# Patient Record
Sex: Female | Born: 1950 | Race: White | Hispanic: No | Marital: Married | Smoking: Never smoker
Health system: Southern US, Community
[De-identification: ages and names within clinical notes are randomized; demographics above are authoritative.]

## PROBLEM LIST (undated history)

## (undated) HISTORY — PX: DE QUERVAIN'S RELEASE: SHX1439

## (undated) HISTORY — PX: TOTAL VAGINAL HYSTERECTOMY: SHX2548

## (undated) HISTORY — PX: OTHER SURGICAL HISTORY: SHX169

## (undated) HISTORY — PX: ANKLE SURGERY: SHX546

## (undated) HISTORY — PX: TUBAL LIGATION: SHX77

## (undated) HISTORY — PX: ASPIRATION OF ABSCESS: SHX6754

## (undated) HISTORY — PX: CHOLECYSTECTOMY: SHX55

## (undated) HISTORY — PX: HERNIA REPAIR: SHX51

## (undated) HISTORY — PX: NASAL SEPTUM SURGERY: SHX37

---

## 2016-08-26 ENCOUNTER — Encounter: Payer: Self-pay | Admitting: Physical Medicine & Rehabilitation

## 2016-10-11 ENCOUNTER — Encounter: Payer: Medicare HMO | Attending: Physical Medicine & Rehabilitation | Admitting: Physical Medicine & Rehabilitation

## 2016-10-11 ENCOUNTER — Encounter: Payer: Self-pay | Admitting: Physical Medicine & Rehabilitation

## 2016-10-11 VITALS — BP 131/80 | HR 77 | Ht 60.0 in | Wt 181.0 lb

## 2016-10-11 DIAGNOSIS — M545 Low back pain: Secondary | ICD-10-CM | POA: Diagnosis present

## 2016-10-11 DIAGNOSIS — M533 Sacrococcygeal disorders, not elsewhere classified: Secondary | ICD-10-CM

## 2016-10-11 DIAGNOSIS — G8929 Other chronic pain: Secondary | ICD-10-CM | POA: Insufficient documentation

## 2016-10-11 DIAGNOSIS — M797 Fibromyalgia: Secondary | ICD-10-CM

## 2016-10-11 DIAGNOSIS — Z9889 Other specified postprocedural states: Secondary | ICD-10-CM | POA: Insufficient documentation

## 2016-10-11 DIAGNOSIS — M4186 Other forms of scoliosis, lumbar region: Secondary | ICD-10-CM | POA: Diagnosis not present

## 2016-10-11 DIAGNOSIS — Z79899 Other long term (current) drug therapy: Secondary | ICD-10-CM

## 2016-10-11 DIAGNOSIS — M4126 Other idiopathic scoliosis, lumbar region: Secondary | ICD-10-CM

## 2016-10-11 DIAGNOSIS — M542 Cervicalgia: Secondary | ICD-10-CM | POA: Diagnosis not present

## 2016-10-11 DIAGNOSIS — Z5181 Encounter for therapeutic drug level monitoring: Secondary | ICD-10-CM

## 2016-10-11 DIAGNOSIS — Z90710 Acquired absence of both cervix and uterus: Secondary | ICD-10-CM | POA: Insufficient documentation

## 2016-10-11 DIAGNOSIS — Z9049 Acquired absence of other specified parts of digestive tract: Secondary | ICD-10-CM | POA: Insufficient documentation

## 2016-10-11 DIAGNOSIS — M48061 Spinal stenosis, lumbar region without neurogenic claudication: Secondary | ICD-10-CM | POA: Insufficient documentation

## 2016-10-11 DIAGNOSIS — M419 Scoliosis, unspecified: Secondary | ICD-10-CM | POA: Insufficient documentation

## 2016-10-11 DIAGNOSIS — M25511 Pain in right shoulder: Secondary | ICD-10-CM | POA: Insufficient documentation

## 2016-10-11 DIAGNOSIS — M47816 Spondylosis without myelopathy or radiculopathy, lumbar region: Secondary | ICD-10-CM

## 2016-10-11 MED ORDER — GABAPENTIN 100 MG PO CAPS: 100.0000 mg | ORAL_CAPSULE | Freq: Three times a day (TID) | ORAL | 3 refills | Status: DC

## 2016-10-11 NOTE — Patient Instructions (Addendum)
PLEASE FEEL FREE TO CALL OUR OFFICE WITH ANY PROBLEMS OR QUESTIONS [phone])  ONCE I HAVE CONFIRMATION THAT YOUR drug  SPECIMEN IS CONSISTENT WITH YOUR HISTORY AND PRESCRIBED MEDICATIONS, I WILL BE WILLING TO PRESCRIBE YOUR PAIN MEDICATION. THE RESULTS OF YOUR URINE TESTING COULD TAKE A WEEK OR MORE TO RETURN, HOWEVER.  IF WE DO NOT CONTACT YOU REGARDING THESE RESULTS WITHIN 10 DAYS, PLEASE CONTACT US.

## 2016-10-11 NOTE — Progress Notes (Signed)
Subjective:    Patient ID: Leah Barton, female    DOB: Apr 08, 1950, 66 y.o.   MRN: [id number]  HPI   This is an initial visit for Leah Barton who has had a 40 year history of chronic low back pain which she attributes to bad techniques when she took care of her daughter, morbid obesity. 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 She tries to be careful what she lifts and when she uses her hands over her head. Most commonly the pain is at her belt line and radiates around the hips to the lateral knee. Standing and walking/activities make the pain worse. Bending sometimes is difficult too. The more she does, the harder it is the following day. The numbness in her leg typically doesn't travel past the knee although she feels some tingling when she stands after sitting too long.   She was diagnosed with fibromyalgia 25-30 years ago at [facility] which causes chronic diffuse pain and fatigue. She was placed on savella about 6 months ago which has really helped. She is concerned that it might be causing some sweating. Baclofen seems to help with spasms.     I found MRI's  from 2016 of the cervical and lumbar spine  C2-C3:No stenosis  C3-C4:Disc osteophyte complex with mild central and bilateral moderate neural foraminal narrowing  C4-C5:Disc osteophyte complex and uncovertebral hypertrophy with mild left neural foraminal narrowing.  C5-C6:Marked disc desiccation with left greater than right uncovertebral hypertrophy and disc osteophyte complex resulting in moderate central, moderate right and moderate left neural foraminal narrowing.  C6-C7: Disc osteophyte complex with left greater than right uncovertebral hypertrophy resulting in mild to moderate central narrowing and mild to moderate bilateral neural foraminal narrowing.  C7-T1:Moderate right and mild left neural foraminal narrowing. No central narrowing  T1-T2:No  significant stenosis   Lumbar:  Leftward curve convex at L3-4  T11-T12: Circumferential bulging disc and mild facet hypertrophy resulting in mild central narrowing and mild bilateral neural foraminal narrowing.  T12-L1: Mild facet hypertrophy without stenosis  L1-L2: Mild facet hypertrophy and circumferential bulging disc with superimposed small central extrusion, with moderate right and mild left neural foraminal narrowing. No central narrowing  L2-L3: Circumferential bulging disc and moderate facet hypertrophy with mild central stenosis. No neural foraminal narrowing  L3-L4: Desiccated circumferential bulging disc with moderate facet hypertrophy and leftward spinal curvature resulting in mild to moderate central narrowing , severe right lateral recess narrowing, mild left lateral recess narrowing, severe right neural foraminal narrowing and mild left neural foraminal narrowing.  L4-L5:Marked facet hypertrophy and spinal curvature results in moderate narrowing of the lateral recesses, left greater than right, severe left neural foraminal narrowing and mild central narrowing  L5-S1: Moderate facet hypertrophy and spinal curvature resulting in severe left neural foraminal narrowing and no central narrowing.Hemi sacralization of the left L5 transverse process, with hypertrophy   She had MBB and then RF ablation at [facility] in early 2016 which lasted a few months as far as her back is concerned. She had a Left L3-4 traslaminar ESI which gave one month of relief in 2011. She's also had bursa injections to  Hips, SI injections as well. Th e SI injections on 10/07/14 provided 70% relief for approximattely 6 months.   In addition to the above medications, for pain relief she had been using hydrocodone 7.5mg  3-4 x per day. Currently, she has been stretching out her ongoing supply to  get to her visit here. She uses mysoline for familial tremors.   She has not had any recent therapy for her low back.  The last time she had therapy it was for a remote ortho issue.    Pain Inventory Average Pain 5 Pain Right Now 7 My pain is sharp, burning, dull, stabbing and aching  In the last 24 hours, has pain interfered with the following? General activity 8 Relation with others 8 Enjoyment of life 10 What TIME of day is your pain at its worst? . Sleep (in general) Fair  Pain is worse with: walking Pain improves with: medication and injections Relief from Meds: 6  Mobility walk with assistance use a cane do you drive?  yes  Function disabled: date disabled 2015  Neuro/Psych bladder control problems numbness tremor tingling trouble walking spasms dizziness confusion depression anxiety  Prior Studies Any changes since last visit?  no  Physicians involved in your care Any changes since last visit?  no   History reviewed. No pertinent family history. Social History   Social History  . Marital status: Married    Spouse name: N/A  . Number of children: N/A  . Years of education: N/A   Social History Main Topics  . Smoking status: Never Smoker  . Smokeless tobacco: Never Used  . Alcohol use No  . Drug use: No  . Sexual activity: Not Asked   Other Topics Concern  . None   Social History Narrative  . None   Past Surgical History:  Procedure Laterality Date  . ANKLE SURGERY    . ASPIRATION OF ABSCESS    . CHOLECYSTECTOMY    . DE QUERVAIN'S RELEASE    . grastricbypas    . HERNIA REPAIR    . NASAL SEPTUM SURGERY    . TOTAL VAGINAL HYSTERECTOMY    . TUBAL LIGATION     History reviewed. No pertinent past medical history. There were no vitals taken for this visit.  Opioid Risk Score:   Fall Risk Score:  `1  Depression screen PHQ 2/9  No flowsheet data found.   Review of Systems  Constitutional: Negative.   HENT: Negative.   Eyes: Negative.   Respiratory: Positive for apnea.   Cardiovascular: Negative.   Gastrointestinal: Negative.   Endocrine:  Negative.   Genitourinary: Negative.   Musculoskeletal: Negative.   Skin: Negative.   Allergic/Immunologic: Negative.   Neurological: Negative.   Hematological: Negative.   Psychiatric/Behavioral: Negative.   All other systems reviewed and are negative.      Objective:   Physical Exam  General: Alert and oriented x 3, No apparent distress. obese HEENT: Head is normocephalic, atraumatic, PERRLA, EOMI, sclera anicteric, oral mucosa pink and moist, dentition intact, ext ear canals clear,  Neck: Supple without JVD or lymphadenopathy Heart: Reg rate and rhythm. No murmurs rubs or gallops Chest: CTA bilaterally without wheezes, rales, or rhonchi; no distress Abdomen: Soft, non-tender, non-distended, bowel sounds positive. Extremities: No clubbing, cyanosis, or edema. Pulses are 2+ Skin: Clean and intact without signs of breakdown Neuro: Pt is cognitively appropriate with normal insight, memory, and awareness. Cranial nerves 2-12 are intact. Sensory exam is normal. Reflexes are 2+ in all 4's. Fine motor coordination is intact. No tremors. Motor function is grossly 5/5.  Musculoskeletal: leftward high lumbar scoliosis---right hemipelvis elevated and left shoulder elevated as a result. Pain with lumbar extension more than flexion although flexion limited to about 80 degrees. Facet maneuvers were positive right more than left. Lateral  bending to right caused tenderness. Rotation didn't cause substantial pain. Pain to palp along bilateral PSIS's as well as in posterior glutes and external hip rotators. Greater trochs were both tenders. Numerous tender areas along neck/head/trunk/amrs/legs. Both knees tender with resisted flexion/extension. Psych: Pt's affect is appropriate. Pt is cooperative         Assessment & Plan:  1. Chronic low back pain with substantial spondylosis and scoliosis by MRI. Symptoms on examination today are most c/w facet arthropathy and SI joint dysfunction. Symptoms are  most severe in the right low back and pelvis.  2. Fibromyalgia 3. Chronic cervicalgia 4. Right shoulder pain   Plan: 1. Trial of gabapentin for her FMS and to assist with her back pain 2. Drug testing was performed today. Consider rx of hydrocodone 7.5 for pain control if testing is consistent, 2-3x daily prn 3. SI joint and facet stretches were provided and resumed  4. D/C cymbalta?? Not needed with savella---serotonin/NEP overload.  5. Consider course of PT although co-pays are a concern for her.  6. Consider MBB's/RF's and SI joint injections.    7. Forty-five minutes of face to face patient care time were spent during this visit. All questions were encouraged and answered. Follow up with me in about a month.

## 2016-10-15 ENCOUNTER — Telehealth: Payer: Self-pay | Admitting: *Deleted

## 2016-10-15 NOTE — Telephone Encounter (Signed)
Leah Barton is calling to see if she can get an order for medication.  Her oral drug screen is not complete yet so no order can be given at this time. I have notfied Leah Barton that it will be ready probably the first part of next week.

## 2016-10-16 LAB — DRUG TOX MONITOR 1 W/CONF, ORAL FLD
AMOBARBITAL: NEGATIVE ng/mL (ref <10)
AMPHETAMINES: NEGATIVE ng/mL (ref <10)
BARBITURATES: POSITIVE ng/mL — AB (ref <10)
BUTALBITAL: NEGATIVE ng/mL (ref <10)
Benzodiazepines: NEGATIVE ng/mL (ref <0.50)
Buprenorphine: NEGATIVE ng/mL (ref <0.025)
CODEINE: NEGATIVE ng/mL (ref <2.5)
Cocaine: NEGATIVE ng/mL (ref <2.5)
Dihydrocodeine: NEGATIVE ng/mL (ref <2.5)
Fentanyl: NEGATIVE ng/mL (ref <0.10)
HEROIN METABOLITE: NEGATIVE ng/mL (ref <1.0)
Hydrocodone: 12.6 ng/mL — ABNORMAL HIGH (ref <2.5)
Hydromorphone: NEGATIVE ng/mL (ref <2.5)
MDMA: NEGATIVE ng/mL (ref <10)
MEPERIDINE: NEGATIVE ng/mL (ref <5.0)
Marijuana: NEGATIVE ng/mL (ref <2.5)
Meprobamate: NEGATIVE ng/mL (ref <2.5)
Methadone: NEGATIVE ng/mL (ref <5.0)
Morphine: NEGATIVE ng/mL (ref <2.5)
NICOTINE METABOLITE: NEGATIVE ng/mL (ref <5.0)
NOROXYCODONE: NEGATIVE ng/mL (ref <2.5)
Norhydrocodone: 3.1 ng/mL — ABNORMAL HIGH (ref <2.5)
OPIATES: POSITIVE ng/mL — AB (ref <2.5)
OXYCODONE: NEGATIVE ng/mL (ref <2.5)
OXYMORPHONE: NEGATIVE ng/mL (ref <2.5)
PHENOBARBITAL: 198 ng/mL — AB (ref <10)
Pentobarbital: NEGATIVE ng/mL (ref <10)
Phencyclidine: NEGATIVE ng/mL (ref <10)
Propoxyphene: NEGATIVE ng/mL (ref <5.0)
SECOBARBITAL: NEGATIVE ng/mL (ref <10)
Tapentadol: NEGATIVE ng/mL (ref <5.0)
Tramadol: NEGATIVE ng/mL (ref <5.0)
ZOLPIDEM: NEGATIVE ng/mL (ref <5.0)

## 2016-10-16 LAB — DRUG TOX ALC METAB W/CON, ORAL FLD: Alcohol Metabolite: NEGATIVE ng/mL (ref <25)

## 2016-10-18 ENCOUNTER — Telehealth: Payer: Self-pay | Admitting: *Deleted

## 2016-10-18 MED ORDER — HYDROCODONE-ACETAMINOPHEN 7.5-325 MG PO TABS: 1.0000 | ORAL_TABLET | Freq: Three times a day (TID) | ORAL | 0 refills | Status: DC | PRN

## 2016-10-18 NOTE — Telephone Encounter (Signed)
Oral swab drug screen was positive for phenobarbital which is a metabolite of her primidone. Hydrocodone is present and she has had a [facility] for this [facility] There was no documentation of when she had a last dose so it is assumed she took one of her prior prescribed hydrocodone. This will be considered consistent.  Per Dr Leah KillSwartz note Leah Barton will sign a prescription for her to begin hydrocodone 7.5-325 tid prn #90. Leah Barton notified. A CSA will be signed at time of Rx pick up.She has been instructed to take only pain medications from Dr Tonita CongSwartz/Eunice Thomas NP from this point forward.

## 2016-11-22 ENCOUNTER — Encounter: Payer: Medicare HMO | Attending: Physical Medicine & Rehabilitation | Admitting: Registered Nurse

## 2016-11-22 ENCOUNTER — Encounter: Payer: Medicare HMO | Admitting: Physical Medicine & Rehabilitation

## 2016-11-22 ENCOUNTER — Encounter: Payer: Self-pay | Admitting: Registered Nurse

## 2016-11-22 VITALS — BP 112/69 | HR 75

## 2016-11-22 DIAGNOSIS — M48061 Spinal stenosis, lumbar region without neurogenic claudication: Secondary | ICD-10-CM | POA: Diagnosis not present

## 2016-11-22 DIAGNOSIS — M47816 Spondylosis without myelopathy or radiculopathy, lumbar region: Secondary | ICD-10-CM | POA: Diagnosis not present

## 2016-11-22 DIAGNOSIS — Z90710 Acquired absence of both cervix and uterus: Secondary | ICD-10-CM | POA: Diagnosis not present

## 2016-11-22 DIAGNOSIS — M797 Fibromyalgia: Secondary | ICD-10-CM | POA: Diagnosis not present

## 2016-11-22 DIAGNOSIS — M542 Cervicalgia: Secondary | ICD-10-CM | POA: Diagnosis not present

## 2016-11-22 DIAGNOSIS — Z9049 Acquired absence of other specified parts of digestive tract: Secondary | ICD-10-CM | POA: Diagnosis not present

## 2016-11-22 DIAGNOSIS — Z5181 Encounter for therapeutic drug level monitoring: Secondary | ICD-10-CM

## 2016-11-22 DIAGNOSIS — M25511 Pain in right shoulder: Secondary | ICD-10-CM | POA: Diagnosis not present

## 2016-11-22 DIAGNOSIS — Z79899 Other long term (current) drug therapy: Secondary | ICD-10-CM | POA: Diagnosis not present

## 2016-11-22 DIAGNOSIS — Z9889 Other specified postprocedural states: Secondary | ICD-10-CM | POA: Diagnosis not present

## 2016-11-22 DIAGNOSIS — M4186 Other forms of scoliosis, lumbar region: Secondary | ICD-10-CM | POA: Insufficient documentation

## 2016-11-22 DIAGNOSIS — G8929 Other chronic pain: Secondary | ICD-10-CM | POA: Insufficient documentation

## 2016-11-22 DIAGNOSIS — M533 Sacrococcygeal disorders, not elsewhere classified: Secondary | ICD-10-CM | POA: Diagnosis not present

## 2016-11-22 DIAGNOSIS — M545 Low back pain: Secondary | ICD-10-CM | POA: Diagnosis present

## 2016-11-22 MED ORDER — HYDROCODONE-ACETAMINOPHEN 7.5-325 MG PO TABS: 1.0000 | ORAL_TABLET | Freq: Three times a day (TID) | ORAL | 0 refills | Status: DC | PRN

## 2016-11-22 NOTE — Progress Notes (Signed)
Subjective:    Patient ID: Leah Barton, female    DOB: 12-25-1950, 66 y.o.   MRN: [id number]  HPI: [name] is a 66 year old female who returns for follow up appointment and medication refill. She states her pai  Is located in her neck, lower back and bilateral knees. Also states she has joint pain all over. Her current exercise regime is walking.   Last Oral Swab was performed on 10/11/2016, it was consistent.    Pain Inventory Average Pain 5 Pain Right Now 5 My pain is sharp, burning, tingling and aching  In the last 24 hours, has pain interfered with the following? General activity 6 Relation with others 6 Enjoyment of life 6 What TIME of day is your pain at its worst? all Sleep (in general) Fair  Pain is worse with: walking, bending, sitting, standing and some activites Pain improves with: rest, medication and injections Relief from Meds: 6  Mobility how many minutes can you walk? 5-10 do you drive?  yes  Function disabled: date disabled 10/2013  Neuro/Psych weakness numbness tremor trouble walking dizziness confusion depression anxiety  Prior Studies Any changes since last visit?  no  Physicians involved in your care Any changes since last visit?  no   History reviewed. No pertinent family history. Social History   Social History  . Marital status: Married    Spouse name: N/A  . Number of children: N/A  . Years of education: N/A   Social History Main Topics  . Smoking status: Never Smoker  . Smokeless tobacco: Never Used  . Alcohol use No  . Drug use: No  . Sexual activity: Not Asked   Other Topics Concern  . None   Social History Narrative  . None   Past Surgical History:  Procedure Laterality Date  . ANKLE SURGERY    . ASPIRATION OF ABSCESS    . CHOLECYSTECTOMY    . DE QUERVAIN'S RELEASE    . grastricbypas    . HERNIA REPAIR    . NASAL SEPTUM SURGERY    . TOTAL VAGINAL HYSTERECTOMY    . TUBAL LIGATION     History  reviewed. No pertinent past medical history. BP 112/69 (BP Location: Left Arm, Patient Position: Sitting, Cuff Size: Normal)   Pulse 75   SpO2 98%   Opioid Risk Score:  7 Fall Risk Score:  `1  Depression screen PHQ 2/9  Depression screen [facility] 2/9 11/22/2016 10/11/2016  Decreased Interest 2 2  Down, Depressed, Hopeless 2 2  PHQ - 2 Score 4 4  Altered sleeping - 2  Tired, decreased energy - 3  Change in appetite - 3  Feeling bad or failure about yourself  - 1  Trouble concentrating - 1  Moving slowly or fidgety/restless - 0  Suicidal thoughts - 1  PHQ-9 Score - 15  Difficult doing work/chores - Somewhat difficult    Review of Systems  HENT: Negative.   Eyes: Negative.   Respiratory: Negative.   Cardiovascular: Negative.   Gastrointestinal: Negative.   Endocrine: Negative.   Genitourinary: Positive for dysuria.       Interstitial cystitis  Musculoskeletal: Positive for gait problem.  Skin: Negative.   Allergic/Immunologic: Negative.   Neurological: Positive for dizziness, tremors, weakness and numbness.  Psychiatric/Behavioral: Positive for confusion and dysphoric mood. The patient is nervous/anxious.   All other systems reviewed and are negative.      Objective:   Physical Exam  Constitutional: She is oriented to  person, place, and time. She appears well-developed and well-nourished.  HENT:  Head: Normocephalic and atraumatic.  Neck: Normal range of motion. Neck supple.  Cardiovascular: Normal rate and regular rhythm.   Pulmonary/Chest: Effort normal and breath sounds normal.  Musculoskeletal:  Normal Muscle Bulk and Muscle Testing Reveals: Upper Extremities: Full ROM and Muscle Strength 5/5 Thoracic Paraspinal Tenderness: T-7-T-9 Lumbar Paraspinal Tenderness: L-3-L-5 Lower Extremities: Full ROM and Muscle Strength 5/5 Arises from chair with ease Narrow Based Gait  Neurological: She is alert and oriented to person, place, and time.  Skin: Skin is warm and dry.    Psychiatric: She has a normal mood and affect.  Nursing note and vitals reviewed.         Assessment & Plan:  1. Chronic low back pain with substantial spondylosis and scoliosis by MRI per Dr. Riley KillSwartz note. Refilled: Hydrocodone 7.5/325 mg one tablet every 8 hours as needed #90.  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 Riley KillSwartz Note MBB"s/RF's and SI joint Injections will be considered.  3. Fibromyalgia: Continue Gabapentin and HEP as Tolerated  30  minutes of face to face patient care time waas spent during this visit. All questions were encouraged and answered.   Follow up in 1 month.

## 2016-11-24 ENCOUNTER — Telehealth: Payer: Self-pay | Admitting: Registered Nurse

## 2016-11-24 NOTE — Telephone Encounter (Signed)
On 11/24/2016 the NCCSR was reviewed no conflict was seen [place] the [facility] Cont[place] Sub[place] Reporting System with multiple prescribers. [name] has a signed narcotic contract with our office. If there were any discrepancies this would have been reported to her physician.

## 2016-12-27 ENCOUNTER — Encounter: Payer: Medicare HMO | Admitting: Registered Nurse

## 2016-12-30 ENCOUNTER — Encounter: Payer: Self-pay | Admitting: Registered Nurse

## 2016-12-30 ENCOUNTER — Encounter: Payer: Medicare HMO | Attending: Physical Medicine & Rehabilitation | Admitting: Registered Nurse

## 2016-12-30 VITALS — BP 133/69 | HR 66

## 2016-12-30 DIAGNOSIS — M47816 Spondylosis without myelopathy or radiculopathy, lumbar region: Secondary | ICD-10-CM | POA: Diagnosis not present

## 2016-12-30 DIAGNOSIS — Z9889 Other specified postprocedural states: Secondary | ICD-10-CM | POA: Insufficient documentation

## 2016-12-30 DIAGNOSIS — M7061 Trochanteric bursitis, right hip: Secondary | ICD-10-CM

## 2016-12-30 DIAGNOSIS — G8929 Other chronic pain: Secondary | ICD-10-CM | POA: Diagnosis present

## 2016-12-30 DIAGNOSIS — M4186 Other forms of scoliosis, lumbar region: Secondary | ICD-10-CM | POA: Insufficient documentation

## 2016-12-30 DIAGNOSIS — M542 Cervicalgia: Secondary | ICD-10-CM | POA: Insufficient documentation

## 2016-12-30 DIAGNOSIS — M797 Fibromyalgia: Secondary | ICD-10-CM | POA: Insufficient documentation

## 2016-12-30 DIAGNOSIS — M7062 Trochanteric bursitis, left hip: Secondary | ICD-10-CM

## 2016-12-30 DIAGNOSIS — M25511 Pain in right shoulder: Secondary | ICD-10-CM | POA: Diagnosis not present

## 2016-12-30 DIAGNOSIS — Z79899 Other long term (current) drug therapy: Secondary | ICD-10-CM | POA: Insufficient documentation

## 2016-12-30 DIAGNOSIS — M25562 Pain in left knee: Secondary | ICD-10-CM | POA: Diagnosis not present

## 2016-12-30 DIAGNOSIS — G894 Chronic pain syndrome: Secondary | ICD-10-CM

## 2016-12-30 DIAGNOSIS — M545 Low back pain: Secondary | ICD-10-CM | POA: Diagnosis present

## 2016-12-30 DIAGNOSIS — Z5181 Encounter for therapeutic drug level monitoring: Secondary | ICD-10-CM | POA: Diagnosis not present

## 2016-12-30 DIAGNOSIS — M48061 Spinal stenosis, lumbar region without neurogenic claudication: Secondary | ICD-10-CM | POA: Diagnosis not present

## 2016-12-30 DIAGNOSIS — Z9049 Acquired absence of other specified parts of digestive tract: Secondary | ICD-10-CM | POA: Insufficient documentation

## 2016-12-30 DIAGNOSIS — Z90710 Acquired absence of both cervix and uterus: Secondary | ICD-10-CM | POA: Diagnosis not present

## 2016-12-30 MED ORDER — HYDROCODONE-ACETAMINOPHEN 7.5-325 MG PO TABS: 1.0000 | ORAL_TABLET | Freq: Three times a day (TID) | ORAL | 0 refills | Status: DC | PRN

## 2016-12-30 MED ORDER — METHYLPREDNISOLONE 4 MG PO TBPK: ORAL_TABLET | ORAL | 0 refills | Status: DC

## 2016-12-30 NOTE — Progress Notes (Signed)
Subjective:    Patient ID: Leah Barton, female    DOB: [birth year], 66 y.o.   MRN: [id number]  HPI: Leah Barton is a 66 year old female who returns for follow up appointment and medication refill. She states her pain  Is located in her lower back, bilateral hip pain R>L and left knees. States her pain in her left knee has increased in intensity, we will order X-ray she verbalizes understanding. Medrol dose pak ordered.  Also reports she has joint pain all over. Her current exercise regime is walking.   Leah Barton Morphine equivalent is 22.50 MME.  Last Oral Swab was performed on 10/11/2016, it was consistent.    Pain Inventory Average Pain 5 Pain Right Now 6 My pain is sharp, burning and stabbing  In the last 24 hours, has pain interfered with the following? General activity 7 Relation with others 7 Enjoyment of life 7 What TIME of day is your pain at its worst? all Sleep (in general) Fair  Pain is worse with: some activites Pain improves with: pacing activities, medication and injections Relief from Meds: 5  Mobility how many minutes can you walk? 5-10 do you drive?  yes  Function disabled: date disabled 10/2013  Neuro/Psych bladder control problems numbness tremor tingling trouble walking spasms dizziness confusion depression anxiety  Prior Studies Any changes since last visit?  no  Physicians involved in your care Any changes since last visit?  no   No family history on file. Social History   Social History  . Marital status: Married    Spouse name: N/A  . Number of children: N/A  . Years of education: N/A   Social History Main Topics  . Smoking status: Never Smoker  . Smokeless tobacco: Never Used  . Alcohol use No  . Drug use: No  . Sexual activity: Not on file   Other Topics Concern  . Not on file   Social History Narrative  . No narrative on file   Past Surgical History:  Procedure Laterality Date  . ANKLE SURGERY    .  ASPIRATION OF ABSCESS    . CHOLECYSTECTOMY    . DE QUERVAIN'S RELEASE    . grastricbypas    . HERNIA REPAIR    . NASAL SEPTUM SURGERY    . TOTAL VAGINAL HYSTERECTOMY    . TUBAL LIGATION     No past medical history on file. There were no vitals taken for this visit.  Opioid Risk Score:  7 Fall Risk Score:  `1  Depression screen PHQ 2/9  Depression screen [facility] 2/9 12/30/2016 11/22/2016 10/11/2016  Decreased Interest 1 2 2   Down, Depressed, Hopeless 1 2 2   PHQ - 2 Score 2 4 4   Altered sleeping - - 2  Tired, decreased energy - - 3  Change in appetite - - 3  Feeling bad or failure about yourself  - - 1  Trouble concentrating - - 1  Moving slowly or fidgety/restless - - 0  Suicidal thoughts - - 1  PHQ-9 Score - - 15  Difficult doing work/chores - - Somewhat difficult    Review of Systems  HENT: Negative.   Eyes: Negative.   Respiratory: Negative.   Cardiovascular: Negative.   Gastrointestinal: Negative.   Endocrine: Negative.   Genitourinary: Positive for dysuria.       Interstitial cystitis  Musculoskeletal: Positive for gait problem.  Skin: Negative.   Allergic/Immunologic: Negative.   Neurological: Positive for dizziness, tremors, weakness and numbness.  Psychiatric/Behavioral: Positive for confusion and dysphoric mood. The patient is nervous/anxious.   All other systems reviewed and are negative.      Objective:   Physical Exam  Constitutional: She is oriented to person, place, and time. She appears well-developed and well-nourished.  HENT:  Head: Normocephalic and atraumatic.  Neck: Normal range of motion. Neck supple.  Cardiovascular: Normal rate and regular rhythm.   Pulmonary/Chest: Effort normal and breath sounds normal.  Musculoskeletal:  Normal Muscle Bulk and Muscle Testing Reveals: Upper Extremities: Full ROM and Muscle Strength 5/5 Thoracic Hypersensitivity Lumbar Paraspinal Tenderness: L-3-L-5 Lower Extremities: Full ROM and Muscle Strength  5/5 Left Lower Extremity Flexion Produces Pain into Patella Arises from chair with ease Narrow Based Gait  Neurological: She is alert and oriented to person, place, and time.  Skin: Skin is warm and dry.  Psychiatric: She has a normal mood and affect.  Nursing note and vitals reviewed.         Assessment & Plan:  1. Chronic low back pain with substantial spondylosis and scoliosis by MRI per Dr. Riley KillSwartz note. Refilled: Hydrocodone 7.5/325 mg one tablet every 8 hours as needed #90.  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 Riley KillSwartz Note MBB"s/RF's and SI joint Injections will be considered.  3. Fibromyalgia: Continue Gabapentin and HEP as Tolerated 4. Bilateral Greater Trochanter Bursitis: RX: Medrol Dose Pak  5. Left Knee Pain: RX: X-ray   20  minutes of face to face patient care time was spent during this visit. All questions were encouraged and answered.  Follow up in 1 month.

## 2016-12-31 ENCOUNTER — Telehealth: Payer: Self-pay | Admitting: Registered Nurse

## 2016-12-31 NOTE — Telephone Encounter (Signed)
On 12/31/2016 the  NCCSR was reviewed no conflict was seen [place] the [facility] [facility] [place] with multiple prescribers. [name]  has a signed narcotic contract with our office. If there were any discrepancies this would have been reported to her physician.

## 2017-01-19 ENCOUNTER — Encounter: Payer: Self-pay | Admitting: Physical Medicine & Rehabilitation

## 2017-01-19 ENCOUNTER — Encounter: Payer: Medicare HMO | Attending: Physical Medicine & Rehabilitation | Admitting: Physical Medicine & Rehabilitation

## 2017-01-19 VITALS — BP 113/73 | HR 65

## 2017-01-19 DIAGNOSIS — M48061 Spinal stenosis, lumbar region without neurogenic claudication: Secondary | ICD-10-CM | POA: Diagnosis not present

## 2017-01-19 DIAGNOSIS — M542 Cervicalgia: Secondary | ICD-10-CM | POA: Diagnosis not present

## 2017-01-19 DIAGNOSIS — Z9889 Other specified postprocedural states: Secondary | ICD-10-CM | POA: Insufficient documentation

## 2017-01-19 DIAGNOSIS — M797 Fibromyalgia: Secondary | ICD-10-CM | POA: Insufficient documentation

## 2017-01-19 DIAGNOSIS — M7061 Trochanteric bursitis, right hip: Secondary | ICD-10-CM | POA: Diagnosis not present

## 2017-01-19 DIAGNOSIS — Z9049 Acquired absence of other specified parts of digestive tract: Secondary | ICD-10-CM | POA: Insufficient documentation

## 2017-01-19 DIAGNOSIS — Z90710 Acquired absence of both cervix and uterus: Secondary | ICD-10-CM | POA: Insufficient documentation

## 2017-01-19 DIAGNOSIS — M47816 Spondylosis without myelopathy or radiculopathy, lumbar region: Secondary | ICD-10-CM

## 2017-01-19 DIAGNOSIS — Z79899 Other long term (current) drug therapy: Secondary | ICD-10-CM | POA: Insufficient documentation

## 2017-01-19 DIAGNOSIS — M4186 Other forms of scoliosis, lumbar region: Secondary | ICD-10-CM | POA: Diagnosis not present

## 2017-01-19 DIAGNOSIS — M25562 Pain in left knee: Secondary | ICD-10-CM | POA: Diagnosis not present

## 2017-01-19 DIAGNOSIS — M25511 Pain in right shoulder: Secondary | ICD-10-CM | POA: Diagnosis not present

## 2017-01-19 DIAGNOSIS — M7062 Trochanteric bursitis, left hip: Secondary | ICD-10-CM | POA: Diagnosis not present

## 2017-01-19 DIAGNOSIS — G8929 Other chronic pain: Secondary | ICD-10-CM | POA: Diagnosis present

## 2017-01-19 DIAGNOSIS — M545 Low back pain: Secondary | ICD-10-CM | POA: Diagnosis present

## 2017-01-19 MED ORDER — HYDROCODONE-ACETAMINOPHEN 7.5-325 MG PO TABS: 1.0000 | ORAL_TABLET | Freq: Three times a day (TID) | ORAL | 0 refills | Status: DC | PRN

## 2017-01-19 NOTE — Patient Instructions (Signed)
CHECK WITH YOUR INSURANCE PLAN ABOUT GABAPENTIN COVERAGE.    PLEASE FEEL FREE TO CALL OUR OFFICE WITH ANY PROBLEMS OR QUESTIONS [phone])

## 2017-01-19 NOTE — Progress Notes (Signed)
Subjective:    Patient ID: Leah BostonBarbara Barton, female    DOB: 1950-04-28, 66 y.o.   MRN: [id number]  HPI   Mrs. Leah Barton is here in follow up of her chronic pain. She has had some good results with the steroid taper written by our NP. It helped her get through some issues her husband had.   Her left knee and right hip continue to give her problems.   Pain Inventory Average Pain 5 Pain Right Now 3 My pain is sharp, burning, stabbing, tingling and aching  In the last 24 hours, has pain interfered with the following? General activity 4 Relation with others 3 Enjoyment of life 6 What TIME of day is your pain at its worst? daytime Sleep (in general) Fair  Pain is worse with: unsure Pain improves with: medication Relief from Meds: 6  Mobility walk without assistance ability to climb steps?  yes do you drive?  yes  Function disabled: date disabled 2015  Neuro/Psych weakness tremor tingling spasms dizziness confusion depression anxiety suicidal thoughts  Prior Studies Any changes since last visit?  no  Physicians involved in your care Any changes since last visit?  no   No family history on file. Social History   Socioeconomic History  . Marital status: Married    Spouse name: Not on file  . Number of children: Not on file  . Years of education: Not on file  . Highest education level: Not on file  Social Needs  . Financial resource strain: Not on file  . Food insecurity - worry: Not on file  . Food insecurity - inability: Not on file  . Transportation needs - medical: Not on file  . Transportation needs - non-medical: Not on file  Occupational History  . Not on file  Tobacco Use  . Smoking status: Never Smoker  . Smokeless tobacco: Never Used  Substance and Sexual Activity  . Alcohol use: No  . Drug use: No  . Sexual activity: Not on file  Other Topics Concern  . Not on file  Social History Narrative  . Not on file   Past Surgical History:    Procedure Laterality Date  . ANKLE SURGERY    . ASPIRATION OF ABSCESS    . CHOLECYSTECTOMY    . DE QUERVAIN'S RELEASE    . grastricbypas    . HERNIA REPAIR    . NASAL SEPTUM SURGERY    . TOTAL VAGINAL HYSTERECTOMY    . TUBAL LIGATION     No past medical history on file. There were no vitals taken for this visit.  Opioid Risk Score:   Fall Risk Score:  `1  Depression screen PHQ 2/9  Dep[facility] screen [facility] 2/[facility] 12/30/2016 11/22/2016 10/11/2016  Decreased Interest 1 2 2   Down, Depressed, Hopeless 1 2 2   PHQ - 2 Score 2 4 4   Altered sleeping - - 2  Tired, decreased energy - - 3  Change in appetite - - 3  Feeling bad or failure about yourself  - - 1  Trouble concentrating - - 1  Moving slowly or fidgety/restless - - 0  Suicidal thoughts - - 1  PHQ-9 Score - - 15  Difficult doing work/chores - - Somewhat difficult     Review of Systems  Constitutional: Negative.   HENT: Negative.   Eyes: Negative.   Respiratory: Negative.   Cardiovascular: Negative.   Gastrointestinal: Negative.   Endocrine: Negative.   Genitourinary: Negative.   Musculoskeletal: Negative.   Skin:  Negative.   Allergic/Immunologic: Negative.   Neurological: Negative.   Hematological: Negative.   Psychiatric/Behavioral: Negative.   All other systems reviewed and are negative.      Objective:   Physical Exam  General: Alert and oriented x 3, No apparent distress. obese HEENT: Head is normocephalic, atraumatic, PERRLA, EOMI, sclera anicteric, oral mucosa pink and moist, dentition intact, ext ear canals clear,  Neck: Supple without JVD or lymphadenopathy Heart: Reg rate and rhythm. No murmurs rubs or gallops Chest: CTA bilaterally without wheezes, rales, or rhonchi; no distress Abdomen: Soft, non-tender, non-distended, bowel sounds positive. Extremities: No clubbing, cyanosis, or edema. Pulses are 2+ Skin: Clean and intact without signs of breakdown Neuro: Pt is cognitively appropriate with normal  insight, memory, and awareness. Cranial nerves 2-12 are intact. Sensory exam is normal. Reflexes are 2+ in all 4's. Fine motor coordination is intact. No tremors. Motor function is grossly 5/5.  Musculoskeletal: high levoscoliosis---right hemipelvis elevated and left shoulder elevated as a result. Facet maneuvers are positive to right and she has pain with flexion to right. . Rotation didn't cause substantial pain. Pain to palp along bilateral  PSIS's. Greater trochs were both tender, right much more than left. Cross leg maneuver positive. . Numerous tender areas along neck/head/trunk/amrs/legs. Both knees tender with resisted flexion/extension.no crepitus or swelling in left knee. Does have bilateral joint line pain.  Psych: Pt's affect is appropriate. Pt is cooperative         Assessment & Plan:  1. Chronic low back pain with substantial spondylosis and scoliosis by MRI. Symptoms on examination today are most c/w facet arthropathy and SI joint dysfunction. Symptoms are most severe in the right low back and pelvis.  2. Fibromyalgia 3. Chronic cervicalgia 4. Right shoulder pain   Plan: 1. Trial of gabapentin for her FMS and to assist with her back pain 2. Drug testing was performed today. Consider rx of hydrocodone 7.5 for pain control if testing is consistent, 2-3x daily prn 3. SI joint and facet stretches were provided and resumed  4. Off savella and cymbalta currently. I would like her to consider resuming gabapentin.  5. Consider course of PT although co-pays are a concern for her.  6. Consider MBB's/RF's and SI joint injections in the future. .    7. Forty-five minutes of face to face patient care time were spent during this visit. All questions were encouraged and answered. Follow up with me in about a month.

## 2017-03-11 ENCOUNTER — Other Ambulatory Visit: Payer: Self-pay

## 2017-03-11 ENCOUNTER — Encounter (INDEPENDENT_AMBULATORY_CARE_PROVIDER_SITE_OTHER): Payer: Self-pay

## 2017-03-11 ENCOUNTER — Encounter: Payer: Medicare HMO | Attending: Physical Medicine & Rehabilitation | Admitting: Registered Nurse

## 2017-03-11 ENCOUNTER — Encounter: Payer: Self-pay | Admitting: Registered Nurse

## 2017-03-11 VITALS — BP 112/65 | HR 65

## 2017-03-11 DIAGNOSIS — M47816 Spondylosis without myelopathy or radiculopathy, lumbar region: Secondary | ICD-10-CM | POA: Diagnosis not present

## 2017-03-11 DIAGNOSIS — Z90710 Acquired absence of both cervix and uterus: Secondary | ICD-10-CM | POA: Insufficient documentation

## 2017-03-11 DIAGNOSIS — M7062 Trochanteric bursitis, left hip: Secondary | ICD-10-CM

## 2017-03-11 DIAGNOSIS — M25562 Pain in left knee: Secondary | ICD-10-CM

## 2017-03-11 DIAGNOSIS — G8929 Other chronic pain: Secondary | ICD-10-CM | POA: Insufficient documentation

## 2017-03-11 DIAGNOSIS — Z79899 Other long term (current) drug therapy: Secondary | ICD-10-CM | POA: Diagnosis not present

## 2017-03-11 DIAGNOSIS — M25511 Pain in right shoulder: Secondary | ICD-10-CM | POA: Insufficient documentation

## 2017-03-11 DIAGNOSIS — M48061 Spinal stenosis, lumbar region without neurogenic claudication: Secondary | ICD-10-CM | POA: Insufficient documentation

## 2017-03-11 DIAGNOSIS — M545 Low back pain: Secondary | ICD-10-CM | POA: Diagnosis present

## 2017-03-11 DIAGNOSIS — M542 Cervicalgia: Secondary | ICD-10-CM | POA: Insufficient documentation

## 2017-03-11 DIAGNOSIS — M797 Fibromyalgia: Secondary | ICD-10-CM | POA: Diagnosis not present

## 2017-03-11 DIAGNOSIS — M4186 Other forms of scoliosis, lumbar region: Secondary | ICD-10-CM | POA: Diagnosis not present

## 2017-03-11 DIAGNOSIS — Z9889 Other specified postprocedural states: Secondary | ICD-10-CM | POA: Diagnosis not present

## 2017-03-11 DIAGNOSIS — Z5181 Encounter for therapeutic drug level monitoring: Secondary | ICD-10-CM

## 2017-03-11 DIAGNOSIS — M7061 Trochanteric bursitis, right hip: Secondary | ICD-10-CM

## 2017-03-11 DIAGNOSIS — Z9049 Acquired absence of other specified parts of digestive tract: Secondary | ICD-10-CM | POA: Diagnosis not present

## 2017-03-11 DIAGNOSIS — G894 Chronic pain syndrome: Secondary | ICD-10-CM

## 2017-03-11 DIAGNOSIS — M25561 Pain in right knee: Secondary | ICD-10-CM | POA: Diagnosis not present

## 2017-03-11 MED ORDER — HYDROCODONE-ACETAMINOPHEN 7.5-325 MG PO TABS: 1.0000 | ORAL_TABLET | Freq: Three times a day (TID) | ORAL | 0 refills | Status: DC | PRN

## 2017-03-11 NOTE — Patient Instructions (Addendum)
Increase Gabapentin: One Capsule in the Morning . One in the Afternoon and Two Capsules at Bedtime . Call office in two weeks

## 2017-03-11 NOTE — Progress Notes (Signed)
Subjective:    Patient ID: Leah Barton, female    DOB: May 22, 1950, 66 y.o.   MRN: [id number]  HPI: [name] is a 66 year old female who returns for follow up appointment and medication refill. She states her pain  Is located in her lower back, bilateral hip pain R>L, bilateral knees and bilateral feet with tingling and burning.We will increase gabapentin, she verbalizes understanding. Also reports joint pain all over. [name] states she uses the hydrocodone the hydrocodone twice a day and when pain is severe three times a day as needed, she returns the hydrocodone prescription. Prescription has been discarded, she agrees to have hip injection with Dr. Riley KillSwartz next month. Her current exercise regime is walking.   [name] Morphine equivalent is 22.50 MME.  Last Oral Swab was performed on 10/11/2016, it was consistent.    Pain Inventory Average Pain 5 Pain Right Now 6 My pain is sharp, burning, dull, stabbing, tingling and aching  In the last 24 hours, has pain interfered with the following? General activity 5 Relation with others 7 Enjoyment of life 7 What TIME of day is your pain at its worst? all Sleep (in general) Fair  Pain is worse with: some activites Pain improves with: pacing activities, medication and injections Relief from Meds: 5  Mobility   Function disabled: date disabled 10/2013  Neuro/Psych bladder control problems bowel control problems numbness tremor tingling trouble walking spasms dizziness confusion depression anxiety  Prior Studies Any changes since last visit?  no  Physicians involved in your care Any changes since last visit?  no   No family history on file. Social History   Socioeconomic History  . Marital status: Married    Spouse name: None  . Number of children: None  . Years of education: None  . Highest education level: None  Social Needs  . Financial resource strain: None  . Food insecurity - worry: None  .  Food insecurity - inability: None  . Transportation needs - medical: None  . Transportation needs - non-medical: None  Occupational History  . None  Tobacco Use  . Smoking status: Never Smoker  . Smokeless tobacco: Never Used  Substance and Sexual Activity  . Alcohol use: No  . Drug use: No  . Sexual activity: None  Other Topics Concern  . None  Social History Narrative  . None   Past Surgical History:  Procedure Laterality Date  . ANKLE SURGERY    . ASPIRATION OF ABSCESS    . CHOLECYSTECTOMY    . DE QUERVAIN'S RELEASE    . grastricbypas    . HERNIA REPAIR    . NASAL SEPTUM SURGERY    . TOTAL VAGINAL HYSTERECTOMY    . TUBAL LIGATION     No past medical history on file. BP 112/65   Pulse 65   SpO2 97%   Opioid Risk Score:  7 Fall Risk Score:  `1  Depression screen PHQ 2/9  [facility] 03/11/2017 12/30/2016 11/22/2016 10/11/2016  Decreased Interest 1 1 2 2   Down, Depressed, Hopeless 1 1 2 2   PHQ - 2 Score 2 2 4 4   Altered sleeping - - - 2  Tired, decreased energy - - - 3  Change in appetite - - - 3  Feeling bad or failure about yourself  - - - 1  Trouble concentrating - - - 1  Moving slowly or fidgety/restless - - - 0  Suicidal thoughts - - -  1  PHQ-9 Score - - - 15  Difficult doing work/chores - - - Somewhat difficult    Review of Systems  HENT: Negative.   Eyes: Negative.   Respiratory: Negative.   Cardiovascular: Negative.   Gastrointestinal: Positive for diarrhea.  Endocrine: Negative.   Genitourinary: Positive for dysuria.       Interstitial cystitis  Musculoskeletal: Positive for gait problem.  Skin: Negative.   Allergic/Immunologic: Negative.   Neurological: Positive for dizziness, tremors, weakness and numbness.  Psychiatric/Behavioral: Positive for confusion and dysphoric mood. The patient is nervous/anxious.   All other systems reviewed and are negative.      Objective:   Physical Exam  Constitutional: She is oriented to  person, place, and time. She appears well-developed and well-nourished.  HENT:  Head: Normocephalic and atraumatic.  Neck: Normal range of motion. Neck supple.  Cardiovascular: Normal rate and regular rhythm.  Pulmonary/Chest: Effort normal and breath sounds normal.  Musculoskeletal:  Normal Muscle Bulk and Muscle Testing Reveals: Upper Extremities: Full ROM and Muscle Strength 5/5 Lumbar Paraspinal Tenderness: L-3-L-5 Right Greater Trochanter Tenderness Lower Extremities: Full ROM and Muscle Strength 5/5 Arises from chair with ease Narrow Based Gait  Neurological: She is alert and oriented to person, place, and time.  Skin: Skin is warm and dry.  Psychiatric: She has a normal mood and affect.  Nursing note and vitals reviewed.         Assessment & Plan:  1. Chronic low back pain with substantial spondylosis and scoliosis by MRI per Dr. Riley KillSwartz note. 03/11/2017 Refilled: Hydrocodone 7.5/325 mg one tablet every 8 hours as needed #90.  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 Riley KillSwartz Note MBB"s/RF's and SI joint Injections will be considered. 03/11/2017 3. Fibromyalgia:Increase Gabapentin and HEP as Tolerated. 03/11/2017 4. Bilateral Greater Trochanter Bursitis R>L: Schedule Right Hip Injection with Dr. Riley KillSwartz. 03/11/2017 5. Bilateral Chronic Knee Pain: Awaiting X-ray Result: Continue Current medication and HEP  Regimen.   20  minutes of face to face patient care time was spent during this visit. All questions were encouraged and answered.  Follow up in 1 month.

## 2017-04-13 ENCOUNTER — Encounter: Payer: Medicare Other | Attending: Physical Medicine & Rehabilitation | Admitting: Physical Medicine & Rehabilitation

## 2017-04-13 ENCOUNTER — Encounter: Payer: Self-pay | Admitting: Physical Medicine & Rehabilitation

## 2017-04-13 ENCOUNTER — Telehealth: Payer: Self-pay

## 2017-04-13 VITALS — BP 137/67 | HR 71 | Resp 14

## 2017-04-13 DIAGNOSIS — G8929 Other chronic pain: Secondary | ICD-10-CM | POA: Diagnosis present

## 2017-04-13 DIAGNOSIS — M4126 Other idiopathic scoliosis, lumbar region: Secondary | ICD-10-CM

## 2017-04-13 DIAGNOSIS — M542 Cervicalgia: Secondary | ICD-10-CM | POA: Diagnosis not present

## 2017-04-13 DIAGNOSIS — M797 Fibromyalgia: Secondary | ICD-10-CM | POA: Insufficient documentation

## 2017-04-13 DIAGNOSIS — M25511 Pain in right shoulder: Secondary | ICD-10-CM | POA: Insufficient documentation

## 2017-04-13 DIAGNOSIS — M47816 Spondylosis without myelopathy or radiculopathy, lumbar region: Secondary | ICD-10-CM | POA: Diagnosis not present

## 2017-04-13 DIAGNOSIS — Z90710 Acquired absence of both cervix and uterus: Secondary | ICD-10-CM | POA: Diagnosis not present

## 2017-04-13 DIAGNOSIS — M533 Sacrococcygeal disorders, not elsewhere classified: Secondary | ICD-10-CM

## 2017-04-13 DIAGNOSIS — M545 Low back pain: Secondary | ICD-10-CM | POA: Diagnosis present

## 2017-04-13 DIAGNOSIS — M7062 Trochanteric bursitis, left hip: Secondary | ICD-10-CM | POA: Diagnosis not present

## 2017-04-13 DIAGNOSIS — M4186 Other forms of scoliosis, lumbar region: Secondary | ICD-10-CM | POA: Insufficient documentation

## 2017-04-13 DIAGNOSIS — M7061 Trochanteric bursitis, right hip: Secondary | ICD-10-CM

## 2017-04-13 DIAGNOSIS — Z79899 Other long term (current) drug therapy: Secondary | ICD-10-CM | POA: Insufficient documentation

## 2017-04-13 DIAGNOSIS — Z9049 Acquired absence of other specified parts of digestive tract: Secondary | ICD-10-CM | POA: Diagnosis not present

## 2017-04-13 DIAGNOSIS — Z9889 Other specified postprocedural states: Secondary | ICD-10-CM | POA: Diagnosis not present

## 2017-04-13 DIAGNOSIS — M48061 Spinal stenosis, lumbar region without neurogenic claudication: Secondary | ICD-10-CM | POA: Insufficient documentation

## 2017-04-13 DIAGNOSIS — Z5181 Encounter for therapeutic drug level monitoring: Secondary | ICD-10-CM

## 2017-04-13 MED ORDER — GABAPENTIN 100 MG PO CAPS: 100.0000 mg | ORAL_CAPSULE | Freq: Three times a day (TID) | ORAL | 3 refills | Status: DC

## 2017-04-13 MED ORDER — HYDROCODONE-ACETAMINOPHEN 7.5-325 MG PO TABS: 1.0000 | ORAL_TABLET | Freq: Three times a day (TID) | ORAL | 0 refills | Status: DC | PRN

## 2017-04-13 NOTE — Telephone Encounter (Signed)
Ordered to wrong pharmacy, new pharmacy obtained, reordered med

## 2017-04-13 NOTE — Progress Notes (Signed)
PROCEDURE NOTE  DIAGNOSIS:  Right greater troch bursitis  INTERVENTION:  Greater troch injection     After informed consent and preparation of the skin with betadine and isopropyl alcohol, I injected 6mg  (1cc) of celestone and 4cc of 1% lidocaine around the right greater trochanter via lateral approach. Additionally, aspiration was performed prior to injection. The patient tolerated well, and no complications were encountered. Afterward the area was cleaned and dressed. Post- injection instructions were provided.    Refilled gabapentin  Discussed stretches, use of ice and heat   Ranelle OysterZachary T. Swartz, MD, [facility] 04/13/2017

## 2017-04-13 NOTE — Patient Instructions (Signed)
PLEASE FEEL FREE TO CALL OUR OFFICE WITH ANY PROBLEMS OR QUESTIONS ([phone])      

## 2017-04-18 LAB — DRUG TOX MONITOR 1 W/CONF, ORAL FLD
AMOBARBITAL: NEGATIVE ng/mL (ref <10)
AMPHETAMINES: NEGATIVE ng/mL (ref <10)
BARBITURATES: POSITIVE ng/mL — AB (ref <10)
BUTALBITAL: NEGATIVE ng/mL (ref <10)
Benzodiazepines: NEGATIVE ng/mL (ref <0.50)
Buprenorphine: NEGATIVE ng/mL (ref <0.10)
COCAINE: NEGATIVE ng/mL (ref <5.0)
Codeine: NEGATIVE ng/mL (ref <2.5)
DIHYDROCODEINE: NEGATIVE ng/mL (ref <2.5)
Fentanyl: NEGATIVE ng/mL (ref <0.10)
HEROIN METABOLITE: NEGATIVE ng/mL (ref <1.0)
HYDROCODONE: 3.7 ng/mL — AB (ref <2.5)
HYDROMORPHONE: NEGATIVE ng/mL (ref <2.5)
MARIJUANA: NEGATIVE ng/mL (ref <2.5)
MDMA: NEGATIVE ng/mL (ref <10)
MORPHINE: NEGATIVE ng/mL (ref <2.5)
Meprobamate: NEGATIVE ng/mL (ref <2.5)
Methadone: NEGATIVE ng/mL (ref <5.0)
Nicotine Metabolite: NEGATIVE ng/mL (ref <5.0)
Norhydrocodone: NEGATIVE ng/mL (ref <2.5)
Noroxycodone: NEGATIVE ng/mL (ref <2.5)
Opiates: POSITIVE ng/mL — AB (ref <2.5)
Oxycodone: NEGATIVE ng/mL (ref <2.5)
Oxymorphone: NEGATIVE ng/mL (ref <2.5)
Pentobarbital: NEGATIVE ng/mL (ref <10)
Phencyclidine: NEGATIVE ng/mL (ref <10)
Phenobarbital: 89 ng/mL — ABNORMAL HIGH (ref <10)
Secobarbital: NEGATIVE ng/mL (ref <10)
TRAMADOL: NEGATIVE ng/mL (ref <5.0)
Tapentadol: NEGATIVE ng/mL (ref <5.0)
Zolpidem: NEGATIVE ng/mL (ref <5.0)

## 2017-04-18 LAB — DRUG TOX ALC METAB W/CON, ORAL FLD: ALCOHOL METABOLITE: NEGATIVE ng/mL (ref <25)

## 2017-04-19 ENCOUNTER — Telehealth: Payer: Self-pay | Admitting: *Deleted

## 2017-04-19 NOTE — Telephone Encounter (Signed)
Oral swab drug screen was consistent for prescribed medications. Primidone causes a positive barbituate/phenobarb level.

## 2017-06-08 ENCOUNTER — Encounter: Payer: Self-pay | Admitting: Physical Medicine & Rehabilitation

## 2017-06-08 ENCOUNTER — Encounter: Payer: Medicare Other | Attending: Physical Medicine & Rehabilitation | Admitting: Physical Medicine & Rehabilitation

## 2017-06-08 VITALS — BP 126/83 | HR 80 | Resp 14 | Ht 60.0 in | Wt 181.0 lb

## 2017-06-08 DIAGNOSIS — M7062 Trochanteric bursitis, left hip: Secondary | ICD-10-CM

## 2017-06-08 DIAGNOSIS — M542 Cervicalgia: Secondary | ICD-10-CM | POA: Insufficient documentation

## 2017-06-08 DIAGNOSIS — M47816 Spondylosis without myelopathy or radiculopathy, lumbar region: Secondary | ICD-10-CM

## 2017-06-08 DIAGNOSIS — M7061 Trochanteric bursitis, right hip: Secondary | ICD-10-CM

## 2017-06-08 DIAGNOSIS — M25511 Pain in right shoulder: Secondary | ICD-10-CM | POA: Insufficient documentation

## 2017-06-08 DIAGNOSIS — M48061 Spinal stenosis, lumbar region without neurogenic claudication: Secondary | ICD-10-CM | POA: Insufficient documentation

## 2017-06-08 DIAGNOSIS — M533 Sacrococcygeal disorders, not elsewhere classified: Secondary | ICD-10-CM

## 2017-06-08 DIAGNOSIS — M4186 Other forms of scoliosis, lumbar region: Secondary | ICD-10-CM | POA: Diagnosis not present

## 2017-06-08 DIAGNOSIS — G8929 Other chronic pain: Secondary | ICD-10-CM | POA: Insufficient documentation

## 2017-06-08 DIAGNOSIS — Z9049 Acquired absence of other specified parts of digestive tract: Secondary | ICD-10-CM | POA: Insufficient documentation

## 2017-06-08 DIAGNOSIS — M797 Fibromyalgia: Secondary | ICD-10-CM | POA: Insufficient documentation

## 2017-06-08 DIAGNOSIS — Z79899 Other long term (current) drug therapy: Secondary | ICD-10-CM | POA: Insufficient documentation

## 2017-06-08 DIAGNOSIS — Z9889 Other specified postprocedural states: Secondary | ICD-10-CM | POA: Insufficient documentation

## 2017-06-08 DIAGNOSIS — Z90710 Acquired absence of both cervix and uterus: Secondary | ICD-10-CM | POA: Insufficient documentation

## 2017-06-08 DIAGNOSIS — M545 Low back pain: Secondary | ICD-10-CM | POA: Diagnosis present

## 2017-06-08 MED ORDER — HYDROCODONE-ACETAMINOPHEN 7.5-325 MG PO TABS: 1.0000 | ORAL_TABLET | Freq: Three times a day (TID) | ORAL | 0 refills | Status: DC | PRN

## 2017-06-08 NOTE — Patient Instructions (Signed)
PLEASE FEEL FREE TO CALL OUR OFFICE WITH ANY PROBLEMS OR QUESTIONS ([phone])      

## 2017-06-08 NOTE — Progress Notes (Signed)
Subjective:    Patient ID: Leah Barton, female    DOB: December 01, 1950, 67 y.o.   MRN: [id number]  HPI   Leah Barton is here in follow up of her chronic pain. She has been dealing with a severe case of dry eye syndrome which has called her a lot of distress and sensitivity to light. She had good results with the greater troch injection, but then proceeded to develop pain higher in the hip near her waist.   I reviewed her most recent MRI which was done at [facility] in 2016.  Only the report is available for review however.  Report reads as follows:   L1-L2: Mild facet hypertrophy and circumferential bulging disc with superimposed small central extrusion, with moderate right and mild left neural foraminal narrowing. No central narrowing  L2-L3: Circumferential bulging disc and moderate facet hypertrophy with mild central stenosis. No neural foraminal narrowing  L3-L4: Desiccated circumferential bulging disc with moderate facet hypertrophy and leftward spinal curvature resulting in mild to moderate central narrowing , severe right lateral recess narrowing, mild left lateral recess narrowing, severe right neural foraminal narrowing and mild left neural foraminal narrowing.  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  Is also on gabapentin 100 mg 3 times daily.   Pain Inventory Average Pain 5 Pain Right Now 4 My pain is constant, sharp, burning, dull, stabbing, tingling and aching  In the last 24 hours, has pain interfered with the following? General  activity 3 Relation with others 2 Enjoyment of life 2 What TIME of day is your pain at its worst? all Sleep (in general) Fair  Pain is worse with: inactivity and some activites Pain improves with: rest, heat/ice, therapy/exercise, medication and injections Relief from Meds: 5  Mobility walk without assistance how many minutes can you walk? 5 ability to climb steps?  yes do you drive?  yes  Function disabled: date disabled . Do you have any goals in this area?  no  Neuro/Psych tremor spasms depression anxiety  Prior Studies Any changes since last visit?  no  Physicians involved in your care Any changes since last visit?  no   History reviewed. No pertinent family history. Social History   Socioeconomic History  . Marital status: Married    Spouse name: Not on file  . Number of children: Not on file  . Years of education: Not on file  . Highest education level: Not on file  Occupational History  . Not on file  Social Needs  . Financial resource strain: Not on file  . Food insecurity:    Worry: Not on file    Inability: Not on file  . Transportation needs:    Medical: Not on file    Non-medical: Not on file  Tobacco Use  . Smoking status: Never Smoker  . Smokeless tobacco: Never Used  Substance and Sexual Activity  . Alcohol use: No  . Drug use: No  . Sexual activity: Not on file  Lifestyle  . Physical activity:    Days per week: Not on file    Minutes per session: Not on file  .  Stress: Not on file  Relationships  . Social connections:    Talks on phone: Not on file    Gets together: Not on file    Attends religious service: Not on file    Active member of club or organization: Not on file    Attends meetings of clubs or organizations: Not on file    Relationship status: Not on file  Other Topics Concern  . Not on file  Social History Narrative  . Not on file   Past Surgical History:  Procedure Laterality Date  . ANKLE SURGERY    .  ASPIRATION OF ABSCESS    . CHOLECYSTECTOMY    . DE QUERVAIN'S RELEASE    . grastricbypas    . HERNIA REPAIR    . NASAL SEPTUM SURGERY    . TOTAL VAGINAL HYSTERECTOMY    . TUBAL LIGATION     History reviewed. No pertinent past medical history. BP 126/83 (BP Location: Left Arm, Patient Position: Sitting, Cuff Size: Normal)   Pulse 80   Resp 14   Ht 5' (1.524 m)   Wt 181 lb (82.1 kg)   SpO2 97%   BMI 35.35 kg/m   Opioid Risk Score:   Fall Risk Score:  `1  Depression screen PHQ 2/9  Depression screen [facility] 2/9 03/11/2017 12/30/2016 11/22/2016 10/11/2016  Decreased Interest 1 1 2 2   Down, Depressed, Hopeless 1 1 2 2   PHQ - 2 Score 2 2 4 4   Altered sleeping - - - 2  Tired, decreased energy - - - 3  Change in appetite - - - 3  Feeling bad or failure about yourself  - - - 1  Trouble concentrating - - - 1  Moving slowly or fidgety/restless - - - 0  Suicidal thoughts - - - 1  PHQ-9 Score - - - 15  Difficult doing work/chores - - - Somewhat difficult    Review of Systems  Musculoskeletal: Positive for arthralgias, back pain and myalgias.       Spasms   Neurological: Positive for tremors and headaches.  Psychiatric/Behavioral: Positive for dysphoric mood. The patient is nervous/anxious.        Objective:   Physical Exam  General: No acute distress HEENT: EOMI, oral membranes moist Cards: reg rate  Chest: normal effort Abdomen: Soft, NT, ND Skin: dry, intact Extremities: no edema Neuro:Pt is cognitively appropriate with normal insight, memory, and awareness. Cranial nerves 2-12 are intact. Sensory exam is normal. Reflexes are 2+ in all 4's. Fine motor coordination is intact. No tremors. Motor function is grossly 5/5.  Musculoskeletal:high levoscoliosis---with elevation of right hemipelvis   Facet maneuvers remain positive to right and she has pain with flexion to right. . Rotation didn't cause substantial pain.  Both PSIS areas are tender.  Greater trochanter areas were  not is tender today.  Tenderness in the right hip was more along the iliac crest.  There were some lower lumbar spasms on the right.  Psych:Affect a bit more flat today but pleasant.      Assessment & Plan:  1. Chronic low back pain with substantial spondylosis and scoliosis by MRI. Symptoms on examination today are most c/w facet arthropathy and SI joint dysfunction. Symptoms are most severe in the right low back and pelvis.  2. Fibromyalgia 3. Chronic cervicalgia 4. Right shoulder pain 5. Dry eye syndrome   Plan: 1. Continue gabapentin for her FMS and to assist with her back pain 2.  Made a referral to  Dr.Kirsteins for medial branch blocks on the right at L4-L5 and L5-S1.  I will need to discuss with him his comfort level and performing these blocks given that we do not have the actual MRI to review.  MRI is from 2016 3. SI joint and facet stretches were reviewed once again 4. Off savella and cymbalta currently.  . .  6. Consider SI joint injections in the future. Marland Kitchen.   7.25  minutes of face to face patient care time were spent during this visit. All questions were encouraged and answered.  Greater than 50% of the time in the office today was spent reviewing her MRI findings discussing potential etiologies for pain and coming up with a custom treatment plan.

## 2017-07-12 ENCOUNTER — Other Ambulatory Visit: Payer: Self-pay

## 2017-07-12 ENCOUNTER — Encounter: Payer: Medicare Other | Attending: Physical Medicine & Rehabilitation

## 2017-07-12 ENCOUNTER — Ambulatory Visit (HOSPITAL_BASED_OUTPATIENT_CLINIC_OR_DEPARTMENT_OTHER): Payer: Medicare Other | Admitting: Physical Medicine & Rehabilitation

## 2017-07-12 ENCOUNTER — Encounter: Payer: Self-pay | Admitting: Physical Medicine & Rehabilitation

## 2017-07-12 DIAGNOSIS — M47816 Spondylosis without myelopathy or radiculopathy, lumbar region: Secondary | ICD-10-CM

## 2017-07-12 DIAGNOSIS — M48061 Spinal stenosis, lumbar region without neurogenic claudication: Secondary | ICD-10-CM | POA: Diagnosis not present

## 2017-07-12 DIAGNOSIS — M797 Fibromyalgia: Secondary | ICD-10-CM | POA: Insufficient documentation

## 2017-07-12 DIAGNOSIS — M545 Low back pain: Secondary | ICD-10-CM | POA: Diagnosis present

## 2017-07-12 DIAGNOSIS — Z9049 Acquired absence of other specified parts of digestive tract: Secondary | ICD-10-CM | POA: Insufficient documentation

## 2017-07-12 DIAGNOSIS — M25511 Pain in right shoulder: Secondary | ICD-10-CM | POA: Insufficient documentation

## 2017-07-12 DIAGNOSIS — Z79899 Other long term (current) drug therapy: Secondary | ICD-10-CM | POA: Diagnosis not present

## 2017-07-12 DIAGNOSIS — M542 Cervicalgia: Secondary | ICD-10-CM | POA: Insufficient documentation

## 2017-07-12 DIAGNOSIS — G8929 Other chronic pain: Secondary | ICD-10-CM | POA: Insufficient documentation

## 2017-07-12 DIAGNOSIS — Z9889 Other specified postprocedural states: Secondary | ICD-10-CM | POA: Insufficient documentation

## 2017-07-12 DIAGNOSIS — Z90710 Acquired absence of both cervix and uterus: Secondary | ICD-10-CM | POA: Insufficient documentation

## 2017-07-12 MED ORDER — HYDROCODONE-ACETAMINOPHEN 7.5-325 MG PO TABS: 1.0000 | ORAL_TABLET | Freq: Three times a day (TID) | ORAL | 0 refills | Status: DC | PRN

## 2017-07-12 NOTE — Patient Instructions (Signed)

## 2017-07-12 NOTE — Progress Notes (Signed)
  PROCEDURE RECORD [facility]   Name: Leah Barton DOB:September 03, 1950 MRN: [id number]  Date:07/12/2017  Physician: Claudette Laws, MD    Nurse/CMA: Kyla Duffy,CMA/Shumaker,RN  Allergies:  Allergies  Allergen Reactions  . Cefaclor Other (See Comments)    Unknown  . Dust Mite Extract Other (See Comments)    Unknown  . Moxifloxacin Other (See Comments)    Unknown  . Poison Ivy Extract Other (See Comments)    Unknown  . Pollen Extract Other (See Comments)    Unknown  . Sulfadiazine Other (See Comments)    Unknown    Consent Signed: Yes.    Is patient diabetic? No.  CBG today?  Pregnant: No. LMP: No LMP recorded. (age 38-55)  Anticoagulants: no Anti-inflammatory: no Antibiotics: no  Procedure: Medical Branch Block Position: Prone Start Time:1:45  End Time: 1:55 Fluoro Time: 44  RN/CMA Adabella Stanis,CMA/Shumaker,RN Baneen Wieseler,CMA/Shumaker,RN    Time 1:11pm 2:01pm    BP 116/78 100/65    Pulse 73 72    Respirations 14 14    O2 Sat 98 98    S/S 6 6    Pain Level 4 4     D/C home with husband, [name] & O X 3, D/C instructions reviewed, and sits independently.

## 2017-08-01 ENCOUNTER — Encounter: Payer: Medicare Other | Admitting: Registered Nurse

## 2017-08-04 ENCOUNTER — Ambulatory Visit: Payer: Medicare Other | Admitting: Registered Nurse

## 2017-08-05 ENCOUNTER — Ambulatory Visit: Payer: Medicare Other | Admitting: Registered Nurse

## 2017-08-17 ENCOUNTER — Encounter: Payer: Self-pay | Admitting: Registered Nurse

## 2017-08-17 ENCOUNTER — Encounter: Payer: Medicare Other | Attending: Physical Medicine & Rehabilitation | Admitting: Registered Nurse

## 2017-08-17 VITALS — BP 135/81 | HR 63 | Resp 14 | Ht 60.0 in | Wt 185.0 lb

## 2017-08-17 DIAGNOSIS — Z9889 Other specified postprocedural states: Secondary | ICD-10-CM | POA: Diagnosis not present

## 2017-08-17 DIAGNOSIS — M797 Fibromyalgia: Secondary | ICD-10-CM | POA: Diagnosis not present

## 2017-08-17 DIAGNOSIS — M25511 Pain in right shoulder: Secondary | ICD-10-CM | POA: Diagnosis not present

## 2017-08-17 DIAGNOSIS — Z5181 Encounter for therapeutic drug level monitoring: Secondary | ICD-10-CM

## 2017-08-17 DIAGNOSIS — M48061 Spinal stenosis, lumbar region without neurogenic claudication: Secondary | ICD-10-CM | POA: Insufficient documentation

## 2017-08-17 DIAGNOSIS — G8929 Other chronic pain: Secondary | ICD-10-CM | POA: Insufficient documentation

## 2017-08-17 DIAGNOSIS — Z90710 Acquired absence of both cervix and uterus: Secondary | ICD-10-CM | POA: Diagnosis not present

## 2017-08-17 DIAGNOSIS — G894 Chronic pain syndrome: Secondary | ICD-10-CM

## 2017-08-17 DIAGNOSIS — M47816 Spondylosis without myelopathy or radiculopathy, lumbar region: Secondary | ICD-10-CM | POA: Diagnosis not present

## 2017-08-17 DIAGNOSIS — Z79899 Other long term (current) drug therapy: Secondary | ICD-10-CM | POA: Diagnosis not present

## 2017-08-17 DIAGNOSIS — M542 Cervicalgia: Secondary | ICD-10-CM | POA: Insufficient documentation

## 2017-08-17 DIAGNOSIS — Z9049 Acquired absence of other specified parts of digestive tract: Secondary | ICD-10-CM | POA: Insufficient documentation

## 2017-08-17 DIAGNOSIS — M545 Low back pain: Secondary | ICD-10-CM | POA: Diagnosis present

## 2017-08-17 DIAGNOSIS — M7061 Trochanteric bursitis, right hip: Secondary | ICD-10-CM | POA: Diagnosis not present

## 2017-08-17 MED ORDER — HYDROCODONE-ACETAMINOPHEN 7.5-325 MG PO TABS: 1.0000 | ORAL_TABLET | Freq: Three times a day (TID) | ORAL | 0 refills | Status: DC | PRN

## 2017-08-17 NOTE — Progress Notes (Signed)
Subjective:    Patient ID: Leah Barton, female    DOB: 31-May-1950, 67 y.o.   MRN: [id number]  HPI: [name] is a 67 year old female who returns for follow up appointment for chronic pain. She states her pain is located in her lower back and right hip. She rates her pain 4. Her current exercise regime is walking.  S/P MBB on 07/12/2017 with some relief noted.   [name] Equivalent is 14.25 MME. Last Oral Swab was Performed on 04/13/2017 it was consistent.   Pain Inventory Average Pain 5 Pain Right Now 4 My pain is sharp, burning, stabbing and aching  In the last 24 hours, has pain interfered with the following? General activity 4 Relation with others 4 Enjoyment of life 4 What TIME of day is your pain at its worst? all Sleep (in general) Fair  Pain is worse with: walking and sitting Pain improves with: heat/ice and medication Relief from Meds: 6  Mobility walk without assistance ability to climb steps?  yes do you drive?  yes  Function retired  Neuro/Psych No problems in this area  Prior Studies Any changes since last visit?  no  Physicians involved in your care Any changes since last visit?  no   History reviewed. No pertinent family history. Social History   Socioeconomic History  . Marital status: Married    Spouse name: Not on file  . Number of children: Not on file  . Years of education: Not on file  . Highest education level: Not on file  Occupational History  . Not on file  Social Needs  . Financial resource strain: Not on file  . Food insecurity:    Worry: Not on file    Inability: Not on file  . Transportation needs:    Medical: Not on file    Non-medical: Not on file  Tobacco Use  . Smoking status: Never Smoker  . Smokeless tobacco: Never Used  Substance and Sexual Activity  . Alcohol use: No  . Drug use: No  . Sexual activity: Not on file  Lifestyle  . Physical activity:    Days per week: Not on file    Minutes  per session: Not on file  . Stress: Not on file  Relationships  . Social connections:    Talks on phone: Not on file    Gets together: Not on file    Attends religious service: Not on file    Active member of club or organization: Not on file    Attends meetings of clubs or organizations: Not on file    Relationship status: Not on file  Other Topics Concern  . Not on file  Social History Narrative  . Not on file   Past Surgical History:  Procedure Laterality Date  . ANKLE SURGERY    . ASPIRATION OF ABSCESS    . CHOLECYSTECTOMY    . DE QUERVAIN'S RELEASE    . grastricbypas    . HERNIA REPAIR    . NASAL SEPTUM SURGERY    . TOTAL VAGINAL HYSTERECTOMY    . TUBAL LIGATION     History reviewed. No pertinent past medical history. BP 135/81 (BP Location: Left Arm, Patient Position: Sitting, Cuff Size: Normal)   Pulse 63   Resp 14   Ht 5' (1.524 m)   Wt 185 lb (83.9 kg)   SpO2 97%   BMI 36.13 kg/m   Opioid Risk Score:   Fall Risk Score:  `  1  Depression screen PHQ 2/9  Depression screen [facility] 2/9 07/12/2017 03/11/2017 12/30/2016 11/22/2016 10/11/2016  Decreased Interest 1 1 1 2 2   Down, Depressed, Hopeless 1 1 1 2 2   PHQ - 2 Score 2 2 2 4 4   Altered sleeping - - - - 2  Tired, decreased energy - - - - 3  Change in appetite - - - - 3  Feeling bad or failure about yourself  - - - - 1  Trouble concentrating - - - - 1  Moving slowly or fidgety/restless - - - - 0  Suicidal thoughts - - - - 1  PHQ-9 Score - - - - 15  Difficult doing work/chores - - - - Somewhat difficult    Review of Systems  Constitutional: Negative.   HENT: Negative.   Eyes: Negative.   Respiratory: Negative.   Cardiovascular: Negative.   Gastrointestinal: Negative.   Endocrine: Negative.   Genitourinary: Negative.   Musculoskeletal: Positive for arthralgias, back pain, gait problem, myalgias and neck pain.  Skin: Negative.   Allergic/Immunologic: Negative.   Hematological: Negative.     Psychiatric/Behavioral: Negative.   All other systems reviewed and are negative.      Objective:   Physical Exam  Constitutional: She is oriented to person, place, and time. She appears well-developed and well-nourished.  HENT:  Head: Normocephalic and atraumatic.  Neck: Normal range of motion. Neck supple.  Cardiovascular: Normal rate and regular rhythm.  Pulmonary/Chest: Effort normal and breath sounds normal.  Musculoskeletal:  Normal Muscle Bulk and Muscle Testing Reveals: Upper Extremities: Full ROM and Muscle Strength 5/5 Lumbar Paraspinal Tenderness: L-4-l-5 Right Greater Trochanter Tenderness Lower Extremities: Full ROM and Muscle Strength 5/5 Arises from Table slowly Antalgic gait  Neurological: She is alert and oriented to person, place, and time.  Skin: Skin is warm and dry.  Psychiatric: She has a normal mood and affect.  Nursing note and vitals reviewed.         Assessment & Plan:  1. Chronic low back pain with substantial spondylosis and scoliosis by MRI per Dr. Riley Kill note. 08/17/2017. Refilled: Hydrocodone 7.5/325 mg one tablet every 8 hours as needed #90.  2. Lumbar Facet Arthropathy/ SI Joint Dysfunction: Continue with Facet Stretches: S/P  MBB on 07/12/2017. 08/17/2017. 3. Fibromyalgia: Continue Gabapentin and HEP as Tolerated. 08/17/2017 4. Right Greater Trochanter Bursitis: Continue to Alternate with Ice and Heat Therapy. 08/17/2017  20  minutes of face to face patient care time was spent during this visit. All questions were encouraged and answered.  Follow up in 1 month.

## 2017-10-14 ENCOUNTER — Encounter: Payer: Medicare Other | Admitting: Registered Nurse

## 2017-10-18 ENCOUNTER — Encounter: Payer: Self-pay | Admitting: Registered Nurse

## 2017-10-18 ENCOUNTER — Encounter: Payer: Medicare Other | Attending: Physical Medicine & Rehabilitation | Admitting: Registered Nurse

## 2017-10-18 VITALS — BP 120/80 | HR 83 | Resp 14 | Ht 60.0 in | Wt 180.0 lb

## 2017-10-18 DIAGNOSIS — M797 Fibromyalgia: Secondary | ICD-10-CM | POA: Diagnosis not present

## 2017-10-18 DIAGNOSIS — M7061 Trochanteric bursitis, right hip: Secondary | ICD-10-CM

## 2017-10-18 DIAGNOSIS — M545 Low back pain: Secondary | ICD-10-CM | POA: Diagnosis present

## 2017-10-18 DIAGNOSIS — M25511 Pain in right shoulder: Secondary | ICD-10-CM | POA: Insufficient documentation

## 2017-10-18 DIAGNOSIS — Z79899 Other long term (current) drug therapy: Secondary | ICD-10-CM | POA: Diagnosis not present

## 2017-10-18 DIAGNOSIS — G894 Chronic pain syndrome: Secondary | ICD-10-CM

## 2017-10-18 DIAGNOSIS — Z5181 Encounter for therapeutic drug level monitoring: Secondary | ICD-10-CM | POA: Diagnosis not present

## 2017-10-18 DIAGNOSIS — M48061 Spinal stenosis, lumbar region without neurogenic claudication: Secondary | ICD-10-CM | POA: Insufficient documentation

## 2017-10-18 DIAGNOSIS — G8929 Other chronic pain: Secondary | ICD-10-CM | POA: Diagnosis present

## 2017-10-18 DIAGNOSIS — Z9889 Other specified postprocedural states: Secondary | ICD-10-CM | POA: Diagnosis not present

## 2017-10-18 DIAGNOSIS — Z9049 Acquired absence of other specified parts of digestive tract: Secondary | ICD-10-CM | POA: Diagnosis not present

## 2017-10-18 DIAGNOSIS — M542 Cervicalgia: Secondary | ICD-10-CM | POA: Diagnosis not present

## 2017-10-18 DIAGNOSIS — M47816 Spondylosis without myelopathy or radiculopathy, lumbar region: Secondary | ICD-10-CM | POA: Diagnosis not present

## 2017-10-18 DIAGNOSIS — Z90710 Acquired absence of both cervix and uterus: Secondary | ICD-10-CM | POA: Diagnosis not present

## 2017-10-18 MED ORDER — HYDROCODONE-ACETAMINOPHEN 7.5-325 MG PO TABS: 1.0000 | ORAL_TABLET | Freq: Three times a day (TID) | ORAL | 0 refills | Status: DC | PRN

## 2017-10-18 NOTE — Progress Notes (Signed)
Subjective:    Patient ID: Leah BostonBarbara Barton, female    DOB: 07/10/1950, 67 y.o.   MRN: [id number]  HPI: Ms. Leah BostonBarbara Barton is a 67 year old female who returns for follow up appointment for chronic pain and medication refill. She states her pain is located in her lower back, right hip and also reports she's having "Fibro Pain all over. She rates her pain 4. Her current exercise regime is walking.   [name] Morphine Equivalent is 22.50 MME. She is also prescribed Clonazepam  by Dr. Vernell LeepPradhan .We have discussed the black box warning of using opioids and benzodiazepines. I highlighted the dangers of using these drugs together and discussed the adverse events including respiratory suppression, overdose, cognitive impairment and importance of compliance with current regimen. We will continue to monitor and adjust as indicated.   Last Oral Swab was Performed on 04/13/2017, it was consistent. Oral Swab Performed Today.   Pain Inventory Average Pain 5 Pain Right Now 4 My pain is constant  In the last 24 hours, has pain interfered with the following? General activity 6 Relation with others 5 Enjoyment of life 5 What TIME of day is your pain at its worst? na Sleep (in general) Fair  Pain is worse with: unsure Pain improves with: pacing activities, medication and injections Relief from Meds: 8  Mobility walk with assistance Do you have any goals in this area?  no  Function disabled: date disabled . Do you have any goals in this area?  no  Neuro/Psych bladder control problems weakness numbness tremor tingling trouble walking depression anxiety  Prior Studies Any changes since last visit?  no  Physicians involved in your care Any changes since last visit?  no   History reviewed. No pertinent family history. Social History   Socioeconomic History  . Marital status: Married    Spouse name: Not on file  . Number of children: Not on file  . Years of education: Not on file  .  Highest education level: Not on file  Occupational History  . Not on file  Social Needs  . Financial resource strain: Not on file  . Food insecurity:    Worry: Not on file    Inability: Not on file  . Transportation needs:    Medical: Not on file    Non-medical: Not on file  Tobacco Use  . Smoking status: Never Smoker  . Smokeless tobacco: Never Used  Substance and Sexual Activity  . Alcohol use: No  . Drug use: No  . Sexual activity: Not on file  Lifestyle  . Physical activity:    Days per week: Not on file    Minutes per session: Not on file  . Stress: Not on file  Relationships  . Social connections:    Talks on phone: Not on file    Gets together: Not on file    Attends religious service: Not on file    Active member of club or organization: Not on file    Attends meetings of clubs or organizations: Not on file    Relationship status: Not on file  Other Topics Concern  . Not on file  Social History Narrative  . Not on file   Past Surgical History:  Procedure Laterality Date  . ANKLE SURGERY    . ASPIRATION OF ABSCESS    . CHOLECYSTECTOMY    . DE QUERVAIN'S RELEASE    . grastricbypas    . HERNIA REPAIR    . NASAL  SEPTUM SURGERY    . TOTAL VAGINAL HYSTERECTOMY    . TUBAL LIGATION     History reviewed. No pertinent past medical history. BP 120/80 (BP Location: Left Arm, Patient Position: Sitting, Cuff Size: Normal)   Pulse 83   Resp 14   Ht 5' (1.524 m)   Wt 180 lb (81.6 kg)   SpO2 97%   BMI 35.15 kg/m   Opioid Risk Score:   Fall Risk Score:  `1  Depression screen PHQ 2/9  Depression screen [facility] 2/9 07/12/2017 03/11/2017 12/30/2016 11/22/2016 10/11/2016  Decreased Interest 1 1 1 2 2   Down, Depressed, Hopeless 1 1 1 2 2   PHQ - 2 Score 2 2 2 4 4   Altered sleeping - - - - 2  Tired, decreased energy - - - - 3  Change in appetite - - - - 3  Feeling bad or failure about yourself  - - - - 1  Trouble concentrating - - - - 1  Moving slowly or  fidgety/restless - - - - 0  Suicidal thoughts - - - - 1  PHQ-9 Score - - - - 15  Difficult doing work/chores - - - - Somewhat difficult    Review of Systems  Constitutional: Negative.   HENT: Negative.   Eyes: Negative.   Respiratory: Negative.   Cardiovascular: Negative.   Gastrointestinal: Negative.   Endocrine: Negative.   Genitourinary: Negative.   Musculoskeletal: Positive for arthralgias and gait problem.  Allergic/Immunologic: Negative.   Neurological: Positive for tremors, weakness and numbness.       Tingling  Psychiatric/Behavioral: Positive for dysphoric mood. The patient is nervous/anxious.   All other systems reviewed and are negative.      Objective:   Physical Exam  Constitutional: She is oriented to person, place, and time. She appears well-developed and well-nourished.  HENT:  Head: Normocephalic and atraumatic.  Neck: Normal range of motion. Neck supple.  Cardiovascular: Normal rate and regular rhythm.  Pulmonary/Chest: Effort normal and breath sounds normal.  Musculoskeletal:  Normal Muscle Bulk and Muscle Testing Reveals: Upper Extremities: Full ROM and Muscle Strength 5/5 Lumbar Paraspinal Tenderness: L-3-L-5 Right Greater Trochanter Tenderness Lower Extremities: Full ROM and Muscle Strength 5/5 Arises from chair slowly Narrow Based Gait  Neurological: She is alert and oriented to person, place, and time.  Skin: Skin is warm and dry.  Psychiatric: She has a normal mood and affect. Her behavior is normal.  Nursing note and vitals reviewed.         Assessment & Plan:  1. Chronic low back pain with substantial spondylosis and scoliosis by MRI per Dr. Riley Kill note.10/18/2017. Refilled: Hydrocodone 7.5/325 mg one tablet every 8 hours as needed #90.  2. Lumbar Facet Arthropathy/ SI Joint Dysfunction: Continue with Facet Stretches: S/P  MBB on 07/12/2017. 10/18/2017. 3. Fibromyalgia: ContinueGabapentin and HEP as Tolerated. 10/18/2017 4. Right  Greater Trochanter Bursitis: Continue to Alternate with Ice and Heat Therapy. 10/18/2017  20 minutes of face to face patient care time was spent during this visit. All questions were encouraged and answered.  Follow up in 2 months.

## 2017-10-21 LAB — DRUG TOX MONITOR 1 W/CONF, ORAL FLD
Amobarbital: NEGATIVE ng/mL (ref <10)
Amphetamines: NEGATIVE ng/mL (ref <10)
BENZODIAZEPINES: NEGATIVE ng/mL (ref <0.50)
BUPRENORPHINE: NEGATIVE ng/mL (ref <0.10)
Barbiturates: POSITIVE ng/mL — AB (ref <10)
Butalbital: NEGATIVE ng/mL (ref <10)
Cocaine: NEGATIVE ng/mL (ref <5.0)
Fentanyl: NEGATIVE ng/mL (ref <0.10)
Heroin Metabolite: NEGATIVE ng/mL (ref <1.0)
MARIJUANA: NEGATIVE ng/mL (ref <2.5)
MDMA: NEGATIVE ng/mL (ref <10)
MEPROBAMATE: NEGATIVE ng/mL (ref <2.5)
METHADONE: NEGATIVE ng/mL (ref <5.0)
Nicotine Metabolite: NEGATIVE ng/mL (ref <5.0)
OPIATES: NEGATIVE ng/mL (ref <2.5)
PENTOBARBITAL: NEGATIVE ng/mL (ref <10)
PHENCYCLIDINE: NEGATIVE ng/mL (ref <10)
PHENOBARBITAL: 244 ng/mL — AB (ref <10)
SECOBARBITAL: NEGATIVE ng/mL (ref <10)
Tapentadol: NEGATIVE ng/mL (ref <5.0)
Tramadol: NEGATIVE ng/mL (ref <5.0)
Zolpidem: NEGATIVE ng/mL (ref <5.0)

## 2017-10-21 LAB — DRUG TOX ALC METAB W/CON, ORAL FLD: Alcohol Metabolite: NEGATIVE ng/mL (ref <25)

## 2017-10-24 ENCOUNTER — Telehealth: Payer: Self-pay | Admitting: *Deleted

## 2017-10-27 NOTE — Telephone Encounter (Signed)
Oral swab is positive for prescribed medication except her hydrocodone. According to the pill count on the visit she reported taking the same day. She was not out of medication.

## 2017-11-10 ENCOUNTER — Telehealth: Payer: Self-pay

## 2017-11-10 DIAGNOSIS — M533 Sacrococcygeal disorders, not elsewhere classified: Secondary | ICD-10-CM

## 2017-11-10 DIAGNOSIS — M797 Fibromyalgia: Secondary | ICD-10-CM

## 2017-11-10 DIAGNOSIS — M4126 Other idiopathic scoliosis, lumbar region: Secondary | ICD-10-CM

## 2017-11-10 DIAGNOSIS — M47816 Spondylosis without myelopathy or radiculopathy, lumbar region: Secondary | ICD-10-CM

## 2017-11-10 NOTE — Telephone Encounter (Signed)
Pt called requesting that her Gabapentin be increased. Can leave message with husband if she is not home.

## 2017-11-11 MED ORDER — GABAPENTIN 300 MG PO CAPS: 300.0000 mg | ORAL_CAPSULE | Freq: Three times a day (TID) | ORAL | 3 refills | Status: DC

## 2017-11-11 MED ORDER — GABAPENTIN 300 MG PO CAPS: ORAL_CAPSULE | ORAL | 3 refills | Status: DC

## 2017-11-11 NOTE — Telephone Encounter (Signed)
Janyla notified.

## 2017-11-11 NOTE — Telephone Encounter (Signed)
Mrs Leah Barton says that Dr Leah Barton and Leah Barton had mentioned she was taking the low side of gabapentin and [facility] and they felt like an increase might help her eyes as well.  Please advise.

## 2017-11-11 NOTE — Telephone Encounter (Signed)
I increased the gabapentin to 300mg . The prescription reads gabapentin 300mg  TID. Please take twice daily for one week then increase to TID if there are no tolerance issues.

## 2017-11-15 ENCOUNTER — Other Ambulatory Visit: Payer: Self-pay | Admitting: Physical Medicine & Rehabilitation

## 2017-11-15 DIAGNOSIS — M4126 Other idiopathic scoliosis, lumbar region: Secondary | ICD-10-CM

## 2017-11-15 DIAGNOSIS — M533 Sacrococcygeal disorders, not elsewhere classified: Secondary | ICD-10-CM

## 2017-11-15 DIAGNOSIS — M797 Fibromyalgia: Secondary | ICD-10-CM

## 2017-11-15 DIAGNOSIS — M47816 Spondylosis without myelopathy or radiculopathy, lumbar region: Secondary | ICD-10-CM

## 2017-11-16 ENCOUNTER — Other Ambulatory Visit: Payer: Self-pay | Admitting: Physical Medicine & Rehabilitation

## 2017-11-16 DIAGNOSIS — M797 Fibromyalgia: Secondary | ICD-10-CM

## 2017-11-16 DIAGNOSIS — M47816 Spondylosis without myelopathy or radiculopathy, lumbar region: Secondary | ICD-10-CM

## 2017-11-16 DIAGNOSIS — M533 Sacrococcygeal disorders, not elsewhere classified: Secondary | ICD-10-CM

## 2017-11-16 DIAGNOSIS — M4126 Other idiopathic scoliosis, lumbar region: Secondary | ICD-10-CM

## 2017-11-16 NOTE — Telephone Encounter (Signed)
Called pt to find out if she still using [facility] because we received a refill request for Gabapentin 100 mg but Gabapentin was increased to 300 mg and [facility] [place], [place]. Left message for pt to call back to clarify

## 2017-11-17 MED ORDER — GABAPENTIN 300 MG PO CAPS: ORAL_CAPSULE | ORAL | 3 refills | Status: DC

## 2017-11-17 MED ORDER — GABAPENTIN 300 MG PO CAPS: ORAL_CAPSULE | ORAL | 2 refills | Status: DC

## 2017-11-17 NOTE — Telephone Encounter (Signed)
Order called to [facility]. They no longer want to use [facility].

## 2017-12-20 ENCOUNTER — Encounter: Payer: Medicare Other | Admitting: Registered Nurse

## 2017-12-26 ENCOUNTER — Encounter: Payer: Self-pay | Admitting: Registered Nurse

## 2017-12-26 ENCOUNTER — Other Ambulatory Visit: Payer: Self-pay

## 2017-12-26 ENCOUNTER — Encounter: Payer: Medicare Other | Attending: Physical Medicine & Rehabilitation | Admitting: Registered Nurse

## 2017-12-26 VITALS — BP 126/60 | HR 78 | Ht 60.0 in | Wt 182.2 lb

## 2017-12-26 DIAGNOSIS — G8929 Other chronic pain: Secondary | ICD-10-CM | POA: Diagnosis present

## 2017-12-26 DIAGNOSIS — Z79899 Other long term (current) drug therapy: Secondary | ICD-10-CM | POA: Diagnosis not present

## 2017-12-26 DIAGNOSIS — M797 Fibromyalgia: Secondary | ICD-10-CM | POA: Insufficient documentation

## 2017-12-26 DIAGNOSIS — Z5181 Encounter for therapeutic drug level monitoring: Secondary | ICD-10-CM | POA: Diagnosis not present

## 2017-12-26 DIAGNOSIS — Z9889 Other specified postprocedural states: Secondary | ICD-10-CM | POA: Diagnosis not present

## 2017-12-26 DIAGNOSIS — R5381 Other malaise: Secondary | ICD-10-CM

## 2017-12-26 DIAGNOSIS — G894 Chronic pain syndrome: Secondary | ICD-10-CM

## 2017-12-26 DIAGNOSIS — M7062 Trochanteric bursitis, left hip: Secondary | ICD-10-CM

## 2017-12-26 DIAGNOSIS — M48061 Spinal stenosis, lumbar region without neurogenic claudication: Secondary | ICD-10-CM | POA: Insufficient documentation

## 2017-12-26 DIAGNOSIS — M7061 Trochanteric bursitis, right hip: Secondary | ICD-10-CM

## 2017-12-26 DIAGNOSIS — Z90710 Acquired absence of both cervix and uterus: Secondary | ICD-10-CM | POA: Diagnosis not present

## 2017-12-26 DIAGNOSIS — M47816 Spondylosis without myelopathy or radiculopathy, lumbar region: Secondary | ICD-10-CM | POA: Diagnosis not present

## 2017-12-26 DIAGNOSIS — Z79891 Long term (current) use of opiate analgesic: Secondary | ICD-10-CM | POA: Diagnosis not present

## 2017-12-26 DIAGNOSIS — M542 Cervicalgia: Secondary | ICD-10-CM | POA: Insufficient documentation

## 2017-12-26 DIAGNOSIS — M25511 Pain in right shoulder: Secondary | ICD-10-CM | POA: Diagnosis not present

## 2017-12-26 DIAGNOSIS — Z9049 Acquired absence of other specified parts of digestive tract: Secondary | ICD-10-CM | POA: Diagnosis not present

## 2017-12-26 DIAGNOSIS — M545 Low back pain: Secondary | ICD-10-CM | POA: Diagnosis present

## 2017-12-26 MED ORDER — HYDROCODONE-ACETAMINOPHEN 7.5-325 MG PO TABS: 1.0000 | ORAL_TABLET | Freq: Three times a day (TID) | ORAL | 0 refills | Status: DC | PRN

## 2017-12-26 NOTE — Progress Notes (Signed)
Subjective:    Patient ID: Leah Barton, female    DOB: [birth year], 67 y.o.   MRN: [id number]  HPI: [name] is a 67 year old female who returns for follow up appointment for chronic pain and medication refill. She states her pain is located in her lower back and occassionally bilateral hip pain L>R. Also states last week she was experiencing increased intensity of bilateral hip pain, it has resolved.  Also reports lower extremities with weakness we discussed physical therapy and she is in agreement with plan. Placed a order for physical therapy, she verbalizes understanding. She rates her pain 3. Her current exercise regime is walking.   [name] Morphine Equivalent is 22.50 MME. She/he  is also prescribed Clonazepam by Dr. [name], she's using it sporadically she reports. Last prescription filled on 09/26/2017.  .We have discussed the black box warning of using opioids and benzodiazepines. I highlighted the dangers of using these drugs together and discussed the adverse events including respiratory suppression, overdose, cognitive impairment and importance of compliance with current regimen. We will continue to monitor and adjust as indicated.   UDS ordered today.   Pain Inventory Average Pain 5 Pain Right Now 3 My pain is tingling  In the last 24 hours, has pain interfered with the following? General activity 3 Relation with others 3 Enjoyment of life 3 What TIME of day is your pain at its worst? all Sleep (in general) Fair  Pain is worse with: n/a Pain improves with: rest, heat/ice, therapy/exercise, pacing activities and injections Relief from Meds: 5  Mobility walk without assistance how many minutes can you walk? 5 ability to climb steps?  yes do you drive?  yes  Function disabled: date disabled 10/2013  Neuro/Psych weakness numbness tremor tingling trouble walking spasms dizziness depression anxiety  Prior Studies Any changes since last visit?   no  Physicians involved in your care Any changes since last visit?  no   No family history on file. Social History   Socioeconomic History  . Marital status: Married    Spouse name: Not on file  . Number of children: Not on file  . Years of education: Not on file  . Highest education level: Not on file  Occupational History  . Not on file  Social Needs  . Financial resource strain: Not on file  . Food insecurity:    Worry: Not on file    Inability: Not on file  . Transportation needs:    Medical: Not on file    Non-medical: Not on file  Tobacco Use  . Smoking status: Never Smoker  . Smokeless tobacco: Never Used  Substance and Sexual Activity  . Alcohol use: No  . Drug use: No  . Sexual activity: Not on file  Lifestyle  . Physical activity:    Days per week: Not on file    Minutes per session: Not on file  . Stress: Not on file  Relationships  . Social connections:    Talks on phone: Not on file    Gets together: Not on file    Attends religious service: Not on file    Active member of club or organization: Not on file    Attends meetings of clubs or organizations: Not on file    Relationship status: Not on file  Other Topics Concern  . Not on file  Social History Narrative  . Not on file   Past Surgical History:  Procedure Laterality Date  .  ANKLE SURGERY    . ASPIRATION OF ABSCESS    . CHOLECYSTECTOMY    . DE QUERVAIN'S RELEASE    . grastricbypas    . HERNIA REPAIR    . NASAL SEPTUM SURGERY    . TOTAL VAGINAL HYSTERECTOMY    . TUBAL LIGATION     No past medical history on file. BP 126/60   Pulse 78   Ht 5' (1.524 m)   Wt 182 lb 3.2 oz (82.6 kg)   SpO2 97%   BMI 35.58 kg/m   Opioid Risk Score:   Fall Risk Score:  `1  Depression screen PHQ 2/9  Depression screen [facility] 2/9 12/26/2017 07/12/2017 03/11/2017 12/30/2016 11/22/2016 10/11/2016  Decreased Interest 1 1 1 1 2 2   Down, Depressed, Hopeless 1 1 1 1 2 2   PHQ - 2 Score 2 2 2 2 4 4   Altered  sleeping - - - - - 2  Tired, decreased energy - - - - - 3  Change in appetite - - - - - 3  Feeling bad or failure about yourself  - - - - - 1  Trouble concentrating - - - - - 1  Moving slowly or fidgety/restless - - - - - 0  Suicidal thoughts - - - - - 1  PHQ-9 Score - - - - - 15  Difficult doing work/chores - - - - - Somewhat difficult   Review of Systems  Constitutional: Negative.   HENT: Negative.   Eyes: Negative.   Respiratory: Negative.   Cardiovascular: Negative.   Gastrointestinal: Negative.   Endocrine: Negative.   Genitourinary: Negative.   Musculoskeletal: Negative.   Skin: Negative.   Allergic/Immunologic: Negative.   Neurological: Negative.   Hematological: Negative.   Psychiatric/Behavioral: Negative.   All other systems reviewed and are negative.      Objective:   Physical Exam  Constitutional: She is oriented to person, place, and time. She appears well-developed and well-nourished.  HENT:  Head: Normocephalic and atraumatic.  Neck: Normal range of motion. Neck supple.  Cardiovascular: Normal rate and regular rhythm.  Pulmonary/Chest: Effort normal and breath sounds normal.  Musculoskeletal:  Normal Muscle Bulk and Muscle Testing Reveals: Upper Extremities: Full ROM and Muscle Strength 5/5 Lumbar Paraspinal Tenderness: L-3-L-5 Lower Extremities: Full ROM and Muscle Strength 5/5 Arises from chair with ease Narrow Based Gait  Neurological: She is alert and oriented to person, place, and time.  Skin: Skin is warm and dry.  Psychiatric: She has a normal mood and affect. Her behavior is normal.  Nursing note and vitals reviewed.         Assessment & Plan:  1. Chronic low back pain with substantial spondylosis and scoliosis by MRI per Dr. Riley Kill note.12/26/2017. Refilled: Hydrocodone 7.5/325 mg one tablet every 8 hours as needed #90.  2. Lumbar Facet Arthropathy/ SI Joint Dysfunction: Continue with Facet Stretches: S/PMBB on 07/12/2017.  12/26/2017. 3. Fibromyalgia:ContinueGabapentin and HEP as Tolerated.12/26/2017 4.BilateralGreater Trochanter Bursitis: Continue to Alternate with Ice and Heat Therapy. 12/26/2017 5. Physical Debility: RX: Physical Therapy.   20 minutes of face to face patient care time was spent during this visit. All questions were encouraged and answered.  Follow up in 2 months.

## 2017-12-26 NOTE — Patient Instructions (Signed)
Call Leah Barton the first week in December [phone] : Regarding Medication Management

## 2018-01-02 LAB — TOXASSURE SELECT,+ANTIDEPR,UR

## 2018-01-04 ENCOUNTER — Telehealth: Payer: Self-pay | Admitting: *Deleted

## 2018-01-04 NOTE — Telephone Encounter (Signed)
Urine drug screen for this encounter is consistent for prescribed medication. She is taking primidone which has phenobarbital as a metabolite.

## 2018-01-06 ENCOUNTER — Telehealth: Payer: Self-pay

## 2018-01-06 NOTE — Telephone Encounter (Signed)
Lafonda Mosses from [facility] called stating a provider signature is needed. Printed order, Riley Lam signed, faxed.

## 2018-02-20 ENCOUNTER — Telehealth: Payer: Self-pay | Admitting: Registered Nurse

## 2018-02-20 NOTE — Telephone Encounter (Signed)
Placed a call to Ms. Kohls regarding schedule conflict, no answer. Left message to return the call

## 2018-02-21 ENCOUNTER — Encounter: Payer: Medicare Other | Attending: Physical Medicine & Rehabilitation | Admitting: Registered Nurse

## 2018-02-21 ENCOUNTER — Other Ambulatory Visit: Payer: Self-pay

## 2018-02-21 ENCOUNTER — Encounter: Payer: Self-pay | Admitting: Registered Nurse

## 2018-02-21 VITALS — BP 136/88 | HR 83 | Ht 60.0 in | Wt 185.0 lb

## 2018-02-21 DIAGNOSIS — Z90710 Acquired absence of both cervix and uterus: Secondary | ICD-10-CM | POA: Diagnosis not present

## 2018-02-21 DIAGNOSIS — M797 Fibromyalgia: Secondary | ICD-10-CM | POA: Diagnosis not present

## 2018-02-21 DIAGNOSIS — M48061 Spinal stenosis, lumbar region without neurogenic claudication: Secondary | ICD-10-CM | POA: Diagnosis not present

## 2018-02-21 DIAGNOSIS — Z5181 Encounter for therapeutic drug level monitoring: Secondary | ICD-10-CM

## 2018-02-21 DIAGNOSIS — Z9049 Acquired absence of other specified parts of digestive tract: Secondary | ICD-10-CM | POA: Diagnosis not present

## 2018-02-21 DIAGNOSIS — Z79899 Other long term (current) drug therapy: Secondary | ICD-10-CM | POA: Insufficient documentation

## 2018-02-21 DIAGNOSIS — M7061 Trochanteric bursitis, right hip: Secondary | ICD-10-CM

## 2018-02-21 DIAGNOSIS — G894 Chronic pain syndrome: Secondary | ICD-10-CM

## 2018-02-21 DIAGNOSIS — M542 Cervicalgia: Secondary | ICD-10-CM | POA: Diagnosis not present

## 2018-02-21 DIAGNOSIS — Z9889 Other specified postprocedural states: Secondary | ICD-10-CM | POA: Diagnosis not present

## 2018-02-21 DIAGNOSIS — M47816 Spondylosis without myelopathy or radiculopathy, lumbar region: Secondary | ICD-10-CM | POA: Diagnosis not present

## 2018-02-21 DIAGNOSIS — M25511 Pain in right shoulder: Secondary | ICD-10-CM | POA: Insufficient documentation

## 2018-02-21 DIAGNOSIS — M545 Low back pain: Secondary | ICD-10-CM | POA: Diagnosis present

## 2018-02-21 DIAGNOSIS — G8929 Other chronic pain: Secondary | ICD-10-CM | POA: Insufficient documentation

## 2018-02-21 MED ORDER — HYDROCODONE-ACETAMINOPHEN 7.5-325 MG PO TABS: 1.0000 | ORAL_TABLET | Freq: Three times a day (TID) | ORAL | 0 refills | Status: DC | PRN

## 2018-02-21 NOTE — Progress Notes (Signed)
Subjective:    Patient ID: Leah Barton, female    DOB: 07-24-1950, 67 y.o.   MRN: [id number]  HPI: Leah Barton is a 67 y.o. female who returns for follow up appointment for chronic pain and medication refill. She states her pain is located in her lower back mainly right side, right hip pain also reports generalized pain all over. She rates her  pain 7. Her current exercise regime is walking and performing stretching exercises.  Leah Barton Morphine equivalent is  7.50  MME. She is also prescribed clonazepam  by Dr. Vernell LeepPradhan.We have discussed the black box warning of using opioids and benzodiazepines. I highlighted the dangers of using these drugs together and discussed the adverse events including respiratory suppression, overdose, cognitive impairment and importance of compliance with current regimen. We will continue to monitor and adjust as indicated.   Last Oral Swab was Performed on 10/18/2017, see note for detail.   Pain Inventory Average Pain 4 Pain Right Now 7 My pain is sharp, dull, stabbing and aching  In the last 24 hours, has pain interfered with the following? General activity 8 Relation with others 8 Enjoyment of life 8 What TIME of day is your pain at its worst? all Sleep (in general) Fair  Pain is worse with: walking, bending, sitting, standing and some activites Pain improves with: medication Relief from Meds: 2  Mobility Do you have any goals in this area?  yes  Function disabled: date disabled 2015  Neuro/Psych bladder control problems weakness numbness tremor tingling trouble walking spasms dizziness confusion depression anxiety  Prior Studies Any changes since last visit?  no  Physicians involved in your care Any changes since last visit?  no   No family history on file. Social History   Socioeconomic History  . Marital status: Married    Spouse name: Not on file  . Number of children: Not on file  . Years of education: Not on file    . Highest education level: Not on file  Occupational History  . Not on file  Social Needs  . Financial resource strain: Not on file  . Food insecurity:    Worry: Not on file    Inability: Not on file  . Transportation needs:    Medical: Not on file    Non-medical: Not on file  Tobacco Use  . Smoking status: Never Smoker  . Smokeless tobacco: Never Used  Substance and Sexual Activity  . Alcohol use: No  . Drug use: No  . Sexual activity: Not on file  Lifestyle  . Physical activity:    Days per week: Not on file    Minutes per session: Not on file  . Stress: Not on file  Relationships  . Social connections:    Talks on phone: Not on file    Gets together: Not on file    Attends religious service: Not on file    Active member of club or organization: Not on file    Attends meetings of clubs or organizations: Not on file    Relationship status: Not on file  Other Topics Concern  . Not on file  Social History Narrative  . Not on file   Past Surgical History:  Procedure Laterality Date  . ANKLE SURGERY    . ASPIRATION OF ABSCESS    . CHOLECYSTECTOMY    . DE QUERVAIN'S RELEASE    . grastricbypas    . HERNIA REPAIR    . NASAL SEPTUM SURGERY    .  TOTAL VAGINAL HYSTERECTOMY    . TUBAL LIGATION     No past medical history on file. BP 136/88   Pulse 83   Ht 5' (1.524 m)   Wt 185 lb (83.9 kg)   SpO2 96%   BMI 36.13 kg/m   Opioid Risk Score:   Fall Risk Score:  `1  Depression screen PHQ 2/9  Depression screen [facility] 2/9 02/21/2018 12/26/2017 07/12/2017 03/11/2017 12/30/2016 11/22/2016 10/11/2016  Decreased Interest 1 1 1 1 1 2 2   Down, Depressed, Hopeless 1 1 1 1 1 2 2   PHQ - 2 Score 2 2 2 2 2 4 4   Altered sleeping - - - - - - 2  Tired, decreased energy - - - - - - 3  Change in appetite - - - - - - 3  Feeling bad or failure about yourself  - - - - - - 1  Trouble concentrating - - - - - - 1  Moving slowly or fidgety/restless - - - - - - 0  Suicidal thoughts - - -  - - - 1  PHQ-9 Score - - - - - - 15  Difficult doing work/chores - - - - - - Somewhat difficult    Review of Systems  Constitutional: Negative.   HENT: Negative.   Eyes: Negative.   Respiratory: Negative.   Cardiovascular: Negative.   Gastrointestinal: Negative.   Endocrine: Negative.   Genitourinary: Negative.   Musculoskeletal: Negative.   Skin: Negative.   Allergic/Immunologic: Negative.   Neurological: Negative.   Hematological: Negative.   Psychiatric/Behavioral: Negative.   All other systems reviewed and are negative.      Objective:   Physical Exam  Constitutional: She is oriented to person, place, and time. She appears well-developed and well-nourished.  HENT:  Head: Normocephalic and atraumatic.  Neck: Normal range of motion. Neck supple.  Cardiovascular: Normal rate and regular rhythm.  Pulmonary/Chest: Effort normal and breath sounds normal.  Musculoskeletal:  Normal Muscle Bulk and Muscle Testing Reveals:  Upper Extremities: Full ROM and Muscle Strength 5/5  Lumbar Paraspinal Tenderness: L-3-L-5 Mainly Right Side Right Greater Trochanter Tenderness  Lower Extremities: Full ROM and Muscle Strength 5/5 Arises from Table with ease Narrow Based Gait   Neurological: She is alert and oriented to person, place, and time.  Skin: Skin is warm and dry.  Psychiatric: She has a normal mood and affect. Her behavior is normal.  Nursing note and vitals reviewed.         Assessment & Plan:  1. Chronic low back pain with substantial spondylosis and scoliosis by MRI per Dr. Riley Kill note.02/21/2018. Refilled: Hydrocodone 7.5/325 mg one tablet every 8 hours as needed #90.  2. Lumbar Facet Arthropathy/ SI Joint Dysfunction: Continue with Facet Stretches: S/PMBB on 07/12/2017. 02/21/2018. 3. Fibromyalgia:ContinueGabapentin and HEP as Tolerated.02/21/2018 4.Right Greater Trochanter Bursitis: Continue to Alternate with Ice and Heat Therapy. 02/21/2018  20 minutes of  face to face patient care time was spent during this visit. All questions were encouraged and answered.  Follow up in21months.

## 2018-04-04 ENCOUNTER — Other Ambulatory Visit: Payer: Self-pay | Admitting: Registered Nurse

## 2018-04-05 NOTE — Telephone Encounter (Signed)
Return Leah Barton, Hydrocodone prescription was e-scribed and PMP was reviewed. She verbalizes understanding.

## 2018-04-05 NOTE — Telephone Encounter (Signed)
Pt called requesting refill on Hydrocodone/APAP. She states she only has #11 left due to taking more after a fall on Christmas Eve. Last filled 03/03/18 #90. According to PMP Aware it was fill 02/21/18 and 03/03/18 but I spoke with pharmacy that was on both entries and pharmacy stated that they only filled on 03/03/18. Next appt 04/19/2018 with Dr. Riley Kill.

## 2018-04-19 ENCOUNTER — Encounter: Payer: Self-pay | Admitting: Physical Medicine & Rehabilitation

## 2018-04-19 ENCOUNTER — Encounter: Payer: Medicare Other | Attending: Physical Medicine & Rehabilitation | Admitting: Physical Medicine & Rehabilitation

## 2018-04-19 ENCOUNTER — Other Ambulatory Visit: Payer: Self-pay

## 2018-04-19 VITALS — BP 102/71 | HR 81 | Ht 60.0 in | Wt 191.8 lb

## 2018-04-19 DIAGNOSIS — G8929 Other chronic pain: Secondary | ICD-10-CM | POA: Insufficient documentation

## 2018-04-19 DIAGNOSIS — Z79899 Other long term (current) drug therapy: Secondary | ICD-10-CM | POA: Diagnosis not present

## 2018-04-19 DIAGNOSIS — Z9049 Acquired absence of other specified parts of digestive tract: Secondary | ICD-10-CM | POA: Insufficient documentation

## 2018-04-19 DIAGNOSIS — M25511 Pain in right shoulder: Secondary | ICD-10-CM | POA: Diagnosis not present

## 2018-04-19 DIAGNOSIS — M5416 Radiculopathy, lumbar region: Secondary | ICD-10-CM

## 2018-04-19 DIAGNOSIS — M797 Fibromyalgia: Secondary | ICD-10-CM | POA: Diagnosis not present

## 2018-04-19 DIAGNOSIS — M546 Pain in thoracic spine: Secondary | ICD-10-CM | POA: Diagnosis not present

## 2018-04-19 DIAGNOSIS — Z90710 Acquired absence of both cervix and uterus: Secondary | ICD-10-CM | POA: Insufficient documentation

## 2018-04-19 DIAGNOSIS — M542 Cervicalgia: Secondary | ICD-10-CM | POA: Insufficient documentation

## 2018-04-19 DIAGNOSIS — M48061 Spinal stenosis, lumbar region without neurogenic claudication: Secondary | ICD-10-CM

## 2018-04-19 DIAGNOSIS — Z9889 Other specified postprocedural states: Secondary | ICD-10-CM | POA: Insufficient documentation

## 2018-04-19 DIAGNOSIS — M47816 Spondylosis without myelopathy or radiculopathy, lumbar region: Secondary | ICD-10-CM | POA: Diagnosis not present

## 2018-04-19 DIAGNOSIS — M4126 Other idiopathic scoliosis, lumbar region: Secondary | ICD-10-CM

## 2018-04-19 DIAGNOSIS — M545 Low back pain: Secondary | ICD-10-CM | POA: Diagnosis present

## 2018-04-19 MED ORDER — HYDROCODONE-ACETAMINOPHEN 7.5-325 MG PO TABS: 1.0000 | ORAL_TABLET | Freq: Three times a day (TID) | ORAL | 0 refills | Status: DC | PRN

## 2018-04-19 NOTE — Progress Notes (Signed)
Subjective:    Patient ID: Leah BostonBarbara Barton, female    DOB: 1950-10-06, 68 y.o.   MRN: [id number]  HPI   Leah MccreedyBarbara is here in follow up of her chronic pain. She had a fall at Christmas time which was pretty hard. Since then she has had increased thoracic pain. The pain is central without radiation. It is worth standing up straight.   She has also noticed (prior to the fall) increased weakness in both legs (R>L). She has the sense that she's going to fall after a few minutes of standing. She is using a cane and has started some PT. she has noticed that her legs have felt heavy at times and likely this is related to the recent fall she had.  Again [facility]: L1-L2: Mild facet hypertrophy and circumferential bulging disc with superimposed small central extrusion, with moderate right and mild left neural foraminal narrowing. No central narrowing  L2-L3: Circumferential bulging disc and moderate facet hypertrophy with mild central stenosis. No neural foraminal narrowing  L3-L4: Desiccated circumferential bulging disc with moderate facet hypertrophy and leftward spinal curvature resulting in mild to moderate central narrowing , severe right lateral recess narrowing, mild left lateral recess narrowing, severe right neural foraminal narrowing and mild left neural foraminal narrowing.  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  Is also on gabapentin 100 mg 3 times daily.  Pain Inventory Average Pain 5 Pain Right Now 4 My pain is  sharp, burning, dull, stabbing, tingling and aching  In the last 24 hours, has pain interfered with the following? General activity 4 Relation with others 5 Enjoyment of life 3 What TIME of day is your pain at its worst? all Sleep (in general) Fair  Pain is worse with: walking, bending, sitting, standing and some activites Pain improves with: medication Relief from Meds: 2  Mobility use a cane do you drive?  yes  Function I need assistance with the following:  household duties and shopping  Neuro/Psych weakness numbness tremor tingling trouble walking spasms dizziness confusion depression anxiety suicidal thoughts  Prior Studies bone scan x-rays CT/MRI  Physicians involved in your care Primary care Gwynneth Alimentradheep Fradhan   No family history on file. Social History   Socioeconomic History  . Marital status: Married    Spouse name: Not on file  . Number of children: Not on file  . Years of education: Not on file  . Highest education level: Not on file  Occupational History  . Not on file  Social Needs  . Financial resource strain: Not on file  . Food insecurity:    Worry: Not on file    Inability: Not on file  . Transportation needs:    Medical: Not on file    Non-medical: Not on file  Tobacco Use  . Smoking status: Never Smoker  . Smokeless tobacco: Never Used  Substance and Sexual Activity  . Alcohol use: No  . Drug use: No  . Sexual activity: Not on file  Lifestyle  . Physical activity:    Days per week: Not on file  Minutes per session: Not on file  . Stress: Not on file  Relationships  . Social connections:    Talks on phone: Not on file    Gets together: Not on file    Attends religious service: Not on file    Active member of club or organization: Not on file    Attends meetings of clubs or organizations: Not on file    Relationship status: Not on file  Other Topics Concern  . Not on file  Social History Narrative  . Not on file    Past Surgical History:  Procedure Laterality Date  . ANKLE SURGERY    . ASPIRATION OF ABSCESS    . CHOLECYSTECTOMY    . DE QUERVAIN'S RELEASE    . grastricbypas    . HERNIA REPAIR    . NASAL SEPTUM SURGERY    . TOTAL VAGINAL HYSTERECTOMY    . TUBAL LIGATION     No past medical history on file. BP 102/71   Pulse 81   Ht 5' (1.524 m)   Wt 191 lb 12.8 oz (87 kg)   SpO2 98%   BMI 37.46 kg/m   Opioid Risk Score:   Fall Risk Score:  `1  Depression screen PHQ 2/9  [facility] 04/19/2018 02/21/2018 12/26/2017 07/12/2017 03/11/2017 12/30/2016 11/22/2016  Decreased Interest 1 1 1 1 1 1 2   Down, Depressed, Hopeless 1 1 1 1 1 1 2   PHQ - 2 Score 2 2 2 2 2 2 4   Altered sleeping - - - - - - -  Tired, decreased energy - - - - - - -  Change in appetite - - - - - - -  Feeling bad or failure about yourself  - - - - - - -  Trouble concentrating - - - - - - -  Moving slowly or fidgety/restless - - - - - - -  Suicidal thoughts - - - - - - -  PHQ-9 Score - - - - - - -  Difficult doing work/chores - - - - - - -   Review of Systems  Constitutional: Negative.   HENT: Negative.   Eyes: Negative.   Respiratory: Negative.   Cardiovascular: Negative.   Gastrointestinal: Negative.   Endocrine: Negative.   Genitourinary: Negative.   Musculoskeletal: Negative.   Skin: Negative.   Allergic/Immunologic: Negative.   Neurological: Positive for dizziness, weakness and numbness.  Hematological: Negative.   Psychiatric/Behavioral: Positive for confusion and dysphoric mood. The patient is nervous/anxious.   All other systems reviewed and are negative.      Objective:   Physical Exam General: No acute distress HEENT: EOMI, oral membranes moist Cards: reg rate  Chest: normal effort Abdomen: Soft, NT, ND Skin: dry, intact Extremities: no edema Neuro:Pt is cognitively appropriate with normal insight, memory, and awareness. Cranial nerves 2-12 are intact. Sensory exam is normal.  Reflexes are 2+ in UEs, 1+ LE's . No tremors. Motor function is grossly 5/5 UE, LE's  3-4/5 with pain inhibition.  Musculoskeletal:ongoing lumbar-thoracic levoscoliosis---with ongoing elevation of right hemipelvis  Facet maneuver's are positive. Pain with flexion and most movements.  Greater trochanter areas were not is tender today.  tender along mid to lower thoracic spine worst with flexion and rotation.  Psych:pleasant.      Assessment & Plan:  1. Chronic low back pain with substantial spondylosis and scoliosis by MRI. Symptoms on examination today are most c/w facet arthropathy and SI joint dysfunction. Symptoms are  most severe in the right low back and pelvis.  2. Fibromyalgia 3. Chronic cervicalgia 4. Right shoulder pain 5. Dry eye syndrome 6. Thoracic spine pain after fall, hx of osteoporosis.    Plan:  1. Continue gabapentin for her FMS and to assist with her back pain/radiculopathy 2.  Will order MRI of lumbar spine given increased lower ext pain and LE weakness. .  3. SI joint and facet stretches ongoing 4.Xrays thoracic spine to assess bones.  6. Consider SI joint injectionsin the future as needed.Marland Kitchen    7.Refilled norco 7.5 #90. We will continue the controlled substance monitoring program, this consists of regular clinic visits, examinations, routine drug screening, pill counts as well as use of [place] Controlled Substance Reporting System. [place].   8. Trigger finger exercises were provided  25  minutes of face to face patient care time were spent during this visit. All questions were encouraged and answered.   Follow up with me in a month

## 2018-04-19 NOTE — Patient Instructions (Signed)
PLEASE FEEL FREE TO CALL OUR OFFICE WITH ANY PROBLEMS OR QUESTIONS ([phone])      

## 2018-05-03 ENCOUNTER — Ambulatory Visit: Payer: Medicare Other | Admitting: Physical Medicine & Rehabilitation

## 2018-05-04 ENCOUNTER — Ambulatory Visit (HOSPITAL_COMMUNITY): Payer: Medicare Other

## 2018-05-11 ENCOUNTER — Ambulatory Visit (HOSPITAL_COMMUNITY)
Admission: RE | Admit: 2018-05-11 | Discharge: 2018-05-11 | Disposition: A | Discharge status: Home / Self Care | Payer: Medicare Other | Source: Ambulatory Visit | Attending: Physical Medicine & Rehabilitation | Admitting: Physical Medicine & Rehabilitation

## 2018-05-11 DIAGNOSIS — M5416 Radiculopathy, lumbar region: Secondary | ICD-10-CM | POA: Diagnosis present

## 2018-05-11 DIAGNOSIS — M48061 Spinal stenosis, lumbar region without neurogenic claudication: Secondary | ICD-10-CM | POA: Insufficient documentation

## 2018-05-11 DIAGNOSIS — M47816 Spondylosis without myelopathy or radiculopathy, lumbar region: Secondary | ICD-10-CM

## 2018-05-11 DIAGNOSIS — M546 Pain in thoracic spine: Secondary | ICD-10-CM | POA: Insufficient documentation

## 2018-05-11 DIAGNOSIS — M4126 Other idiopathic scoliosis, lumbar region: Secondary | ICD-10-CM | POA: Insufficient documentation

## 2018-05-16 ENCOUNTER — Other Ambulatory Visit: Payer: Self-pay | Admitting: Physical Medicine & Rehabilitation

## 2018-05-16 ENCOUNTER — Telehealth (HOSPITAL_COMMUNITY): Payer: Self-pay | Admitting: Physical Medicine & Rehabilitation

## 2018-05-16 DIAGNOSIS — M546 Pain in thoracic spine: Secondary | ICD-10-CM

## 2018-05-16 DIAGNOSIS — M5416 Radiculopathy, lumbar region: Secondary | ICD-10-CM

## 2018-05-16 DIAGNOSIS — M48061 Spinal stenosis, lumbar region without neurogenic claudication: Secondary | ICD-10-CM

## 2018-05-16 DIAGNOSIS — M4126 Other idiopathic scoliosis, lumbar region: Secondary | ICD-10-CM

## 2018-05-16 DIAGNOSIS — M47816 Spondylosis without myelopathy or radiculopathy, lumbar region: Secondary | ICD-10-CM

## 2018-05-16 MED ORDER — HYDROCODONE-ACETAMINOPHEN 7.5-325 MG PO TABS: 1.0000 | ORAL_TABLET | Freq: Three times a day (TID) | ORAL | 0 refills | Status: DC | PRN

## 2018-05-16 NOTE — Telephone Encounter (Signed)
I called and spoke to Leah Barton re: xr/MRI. She will follow up with me in April. No changes to plan at present, consider injection potentially based on exam.

## 2018-05-17 ENCOUNTER — Ambulatory Visit: Payer: Medicare Other | Admitting: Physical Medicine & Rehabilitation

## 2018-05-23 ENCOUNTER — Encounter: Payer: Medicare Other | Admitting: Physical Medicine & Rehabilitation

## 2018-07-05 ENCOUNTER — Encounter: Payer: Self-pay | Admitting: Physical Medicine & Rehabilitation

## 2018-07-05 ENCOUNTER — Encounter: Payer: Medicare Other | Attending: Physical Medicine & Rehabilitation | Admitting: Physical Medicine & Rehabilitation

## 2018-07-05 ENCOUNTER — Other Ambulatory Visit: Payer: Self-pay

## 2018-07-05 DIAGNOSIS — Z9049 Acquired absence of other specified parts of digestive tract: Secondary | ICD-10-CM | POA: Insufficient documentation

## 2018-07-05 DIAGNOSIS — G8929 Other chronic pain: Secondary | ICD-10-CM | POA: Insufficient documentation

## 2018-07-05 DIAGNOSIS — M25511 Pain in right shoulder: Secondary | ICD-10-CM | POA: Insufficient documentation

## 2018-07-05 DIAGNOSIS — M4126 Other idiopathic scoliosis, lumbar region: Secondary | ICD-10-CM | POA: Diagnosis not present

## 2018-07-05 DIAGNOSIS — Z9889 Other specified postprocedural states: Secondary | ICD-10-CM | POA: Insufficient documentation

## 2018-07-05 DIAGNOSIS — M546 Pain in thoracic spine: Secondary | ICD-10-CM

## 2018-07-05 DIAGNOSIS — M542 Cervicalgia: Secondary | ICD-10-CM | POA: Insufficient documentation

## 2018-07-05 DIAGNOSIS — Z79899 Other long term (current) drug therapy: Secondary | ICD-10-CM | POA: Insufficient documentation

## 2018-07-05 DIAGNOSIS — M797 Fibromyalgia: Secondary | ICD-10-CM | POA: Insufficient documentation

## 2018-07-05 DIAGNOSIS — M47816 Spondylosis without myelopathy or radiculopathy, lumbar region: Secondary | ICD-10-CM | POA: Insufficient documentation

## 2018-07-05 DIAGNOSIS — M48061 Spinal stenosis, lumbar region without neurogenic claudication: Secondary | ICD-10-CM | POA: Insufficient documentation

## 2018-07-05 DIAGNOSIS — M5416 Radiculopathy, lumbar region: Secondary | ICD-10-CM

## 2018-07-05 DIAGNOSIS — Z90710 Acquired absence of both cervix and uterus: Secondary | ICD-10-CM | POA: Insufficient documentation

## 2018-07-05 MED ORDER — HYDROCODONE-ACETAMINOPHEN 7.5-325 MG PO TABS: 1.0000 | ORAL_TABLET | Freq: Three times a day (TID) | ORAL | 0 refills | Status: DC | PRN

## 2018-07-05 NOTE — Progress Notes (Signed)
Subjective:    Patient ID: Leah BostonBarbara Hengel, female    DOB: 03-22-1950, 68 y.o.   MRN: [id number]  HPI   Due to national recommendations of social distancing because of COVID 4319, an audio/video tele-health visit is felt to be the most appropriate encounter for this patient at this time. See MyChart message from today for the patient's consent to a [facility]. This is a follow up telephone visit for the patient who is at home. MD is at office.    I am meeting with the patient today regarding her chronic pain.  She has been home given the social distancing which has been tough for her like many.   At our last visit I ordered an MRI which I have previously reviewed with her. Findings are as follows:   T12-L1: Mild disc bulging and right facet arthropathy. Mild right neuroforaminal stenosis. No spinal canal or left neuroforaminal stenosis.  L1-L2: Right eccentric disc bulging and endplate spurring. Mild right facet arthropathy. Mild right lateral recess stenosis. No spinal canal or neuroforaminal stenosis.  L2-L3: Minimal left eccentric disc bulging. Mild to moderate bilateral facet arthropathy. No stenosis.  L3-L4: Small shallow left subarticular and foraminal disc protrusion. Right-sided disc osteophyte complex. Moderate right and mild left facet arthropathy. Mild spinal canal and left lateral recess stenosis. Mild right neuroforaminal stenosis.  L4-L5: Minimal disc bulging and endplate spurring eccentric to the left. Moderate left facet arthropathy. Mild to moderate left lateral recess and neuroforaminal stenosis. No spinal canal or right neuroforaminal stenosis.  L5-S1: Small left subarticular and foraminal disc osteophyte complex. Moderate left facet arthropathy. Moderate left neuroforaminal stenosis. No spinal canal or right neuroforaminal Stenosis.  She is having more hip pain than anything else on the right.  The pain starts at the top of the hip with radiation to side of the hip. It really doesn't radiate down leg. She is doing some basic stretches at home.   She remains on hydrocodone for pain control as well as gabapentin, baclofen. She reports no bowel or bladder issues.   Pain Inventory Average Pain 5 Pain Right Now 4 My pain is aching and throbbing  In the last 24 hours, has pain interfered with the following? General activity 4 Relation with others 4 Enjoyment of life 4 What TIME of day is your pain at its worst? varies Sleep (in general) Fair  Pain is worse with: walking, bending, sitting, inactivity, standing and some activites Pain improves with: rest, heat/ice and medication Relief from Meds: 5  Mobility use a cane how many minutes can you walk? 10 ability to climb steps?  yes do you drive?  yes  Function retired  Neuro/Psych weakness numbness tingling trouble walking depression anxiety  Prior Studies CT/MRI  Physicians involved in your care Any changes since last visit?  no   No family history on file. Social History   Socioeconomic History  . Marital status: Married    Spouse name: Not on file  . Number of children: Not on file  . Years of education: Not on file  . Highest education level: Not on file  Occupational History  . Not on file  Social Needs  . Financial resource strain: Not on file  . Food insecurity:    Worry: Not on file    Inability: Not on file  . Transportation needs:    Medical: Not on file    Non-medical: Not on file  Tobacco Use  .  Smoking status: Never Smoker  . Smokeless tobacco: Never Used  Substance and Sexual Activity  . Alcohol use: No  . Drug use: No  . Sexual activity: Not on file  Lifestyle  . Physical activity:    Days per week: Not on file    Minutes per session: Not on file  . Stress: Not on file  Relationships  . Social connections:    Talks on phone: Not on file    Gets together: Not on file     Attends religious service: Not on file    Active member of club or organization: Not on file    Attends meetings of clubs or organizations: Not on file    Relationship status: Not on file  Other Topics Concern  . Not on file  Social History Narrative  . Not on file   Past Surgical History:  Procedure Laterality Date  . ANKLE SURGERY    . ASPIRATION OF ABSCESS    . CHOLECYSTECTOMY    . DE QUERVAIN'S RELEASE    . grastricbypas    . HERNIA REPAIR    . NASAL SEPTUM SURGERY    . TOTAL VAGINAL HYSTERECTOMY    . TUBAL LIGATION     No past medical history on file. Ht 5' (1.524 m)   Wt 191 lb (86.6 kg)   BMI 37.30 kg/m   Opioid Risk Score:   Fall Risk Score:  `1  Depression screen PHQ 2/9  Depression screen [facility] 2/9 07/05/2018 04/19/2018 02/21/2018 12/26/2017 07/12/2017 03/11/2017 12/30/2016  Decreased Interest 1 1 1 1 1 1 1   Down, Depressed, Hopeless 1 1 1 1 1 1 1   PHQ - 2 Score 2 2 2 2 2 2 2   Altered sleeping - - - - - - -  Tired, decreased energy - - - - - - -  Change in appetite - - - - - - -  Feeling bad or failure about yourself  - - - - - - -  Trouble concentrating - - - - - - -  Moving slowly or fidgety/restless - - - - - - -  Suicidal thoughts - - - - - - -  PHQ-9 Score - - - - - - -  Difficult doing work/chores - - - - - - -   Review of Systems  Constitutional: Negative.   HENT: Positive for sinus pressure, sinus pain, sneezing and sore throat.   Eyes: Negative.   Respiratory: Negative.   Cardiovascular: Negative.   Gastrointestinal: Negative.   Endocrine: Negative.   Genitourinary: Negative.   Musculoskeletal: Positive for back pain.       Right hip pain  Skin: Negative.   Allergic/Immunologic: Negative.   Neurological: Positive for tremors, weakness and numbness.       Tingling  Hematological: Negative.   Psychiatric/Behavioral: Negative.   All other systems reviewed and are negative.      Assessment & Plan:  1. Chronic low back pain with substantial  spondylosis and scoliosis by MRI. Symptoms on examination today are most c/w facet arthropathy and SI joint dysfunction. Symptoms are most severe in the right low back and pelvis.  2. Fibromyalgia 3. Chronic cervicalgia 4. Right shoulder pain 5. Dry eye syndrome 6. Thoracic spine pain after fall, hx of osteoporosis.  7. Right hip pain potentially a referral from back vs trochanteric bursitis   Plan:  1.Continuegabapentin for her FMS and to assist with her back pain/radiculopathy 2.Reviewed MRI findings again today. Not one  specific area is causing her pain. Her pain in fact may be more from her right hip. Will discuss more about when I see her in person. Consider intervention at that time.. .  3. SI joint and facet stretches ongoing. TFL stretches too.      4.Refilled norco 7.5 #90.  -We will continue the controlled substance monitoring program, this consists of regular clinic visits, examinations, routine drug screening, pill counts as well as use of [place] Controlled Substance Reporting System. [place].   -Medication was refilled and a second prescription was sent to the patient's pharmacy for next month.   5. Trigger finger HEP  11 minutes of tele-visit time was spent with this patient today. Follow up in 6 weeks

## 2018-08-15 ENCOUNTER — Encounter: Payer: Medicare Other | Attending: Physical Medicine & Rehabilitation | Admitting: Registered Nurse

## 2018-08-15 ENCOUNTER — Other Ambulatory Visit: Payer: Self-pay

## 2018-08-15 VITALS — Ht 60.0 in | Wt 191.0 lb

## 2018-08-15 DIAGNOSIS — M4126 Other idiopathic scoliosis, lumbar region: Secondary | ICD-10-CM

## 2018-08-15 DIAGNOSIS — Z9889 Other specified postprocedural states: Secondary | ICD-10-CM | POA: Insufficient documentation

## 2018-08-15 DIAGNOSIS — M47816 Spondylosis without myelopathy or radiculopathy, lumbar region: Secondary | ICD-10-CM | POA: Diagnosis not present

## 2018-08-15 DIAGNOSIS — M542 Cervicalgia: Secondary | ICD-10-CM | POA: Insufficient documentation

## 2018-08-15 DIAGNOSIS — Z90710 Acquired absence of both cervix and uterus: Secondary | ICD-10-CM | POA: Insufficient documentation

## 2018-08-15 DIAGNOSIS — M5416 Radiculopathy, lumbar region: Secondary | ICD-10-CM

## 2018-08-15 DIAGNOSIS — Z5181 Encounter for therapeutic drug level monitoring: Secondary | ICD-10-CM

## 2018-08-15 DIAGNOSIS — Z79899 Other long term (current) drug therapy: Secondary | ICD-10-CM | POA: Insufficient documentation

## 2018-08-15 DIAGNOSIS — M7062 Trochanteric bursitis, left hip: Secondary | ICD-10-CM

## 2018-08-15 DIAGNOSIS — G894 Chronic pain syndrome: Secondary | ICD-10-CM

## 2018-08-15 DIAGNOSIS — M797 Fibromyalgia: Secondary | ICD-10-CM | POA: Insufficient documentation

## 2018-08-15 DIAGNOSIS — M546 Pain in thoracic spine: Secondary | ICD-10-CM

## 2018-08-15 DIAGNOSIS — G8929 Other chronic pain: Secondary | ICD-10-CM | POA: Insufficient documentation

## 2018-08-15 DIAGNOSIS — Z9049 Acquired absence of other specified parts of digestive tract: Secondary | ICD-10-CM | POA: Insufficient documentation

## 2018-08-15 DIAGNOSIS — M7061 Trochanteric bursitis, right hip: Secondary | ICD-10-CM

## 2018-08-15 DIAGNOSIS — M25511 Pain in right shoulder: Secondary | ICD-10-CM | POA: Insufficient documentation

## 2018-08-15 DIAGNOSIS — M48061 Spinal stenosis, lumbar region without neurogenic claudication: Secondary | ICD-10-CM | POA: Insufficient documentation

## 2018-08-15 MED ORDER — HYDROCODONE-ACETAMINOPHEN 7.5-325 MG PO TABS: 1.0000 | ORAL_TABLET | Freq: Three times a day (TID) | ORAL | 0 refills | Status: DC | PRN

## 2018-08-15 NOTE — Progress Notes (Signed)
Subjective:    Patient ID: Leah Barton, female    DOB: 1951/02/09, 68 y.o.   MRN: [id number]  HPI: Leah Barton is a 68 y.o. female her appointment was changed, due to national recommendations of social distancing due to COVID 19, an audio/video telehealth visit is felt to be most appropriate for this patient at this time.  See Chart message from today for the patient's consent to telehealth from [facility].    She  states her pain is located in her lower back and bilateral hips R>L. She rates her pain 5. Her current exercise regime is walking.   [name] Morphine equivalent is 22.50 MME.  Last UDS was Performed on 12/26/2017, it was consistent.  Silas Sacramento CMA asked the health and istory Questions. This provider and Angelica Chessman verified we were speaking with the correct person using two identifiers.  Pain Inventory Average Pain 5 Pain Right Now 5 My pain is aching  In the last 24 hours, has pain interfered with the following? General activity 5 Relation with others 5 Enjoyment of life 5 What TIME of day is your pain at its worst? varies Sleep (in general) Fair  Pain is worse with: walking, bending, sitting, inactivity, standing and some activites Pain improves with: rest, heat/ice and medication Relief from Meds: 5  Mobility use a cane how many minutes can you walk? 10 ability to climb steps?  yes do you drive?  yes  Function retired  Neuro/Psych weakness numbness tremor tingling trouble walking depression anxiety  Prior Studies [place]  Phys[place] involved in your care Primary care  Dr. Romeo Rabon   No family history on file. Social History   Socioeconomic History  . Marital status: Married    Spouse name: Not on file  . Number of children: Not on file  . Years of education: Not on file  . Highest education level: Not on file  Occupational History  . Not on file  Social Needs  . Financial resource strain:  Not on file  . Food insecurity:    Worry: Not on file    Inability: Not on file  . Transportation needs:    Medical: Not on file    Non-medical: Not on file  Tobacco Use  . Smoking status: Never Smoker  . Smokeless tobacco: Never Used  Substance and Sexual Activity  . Alcohol use: No  . Drug use: No  . Sexual activity: Not on file  Lifestyle  . Physical activity:    Days per week: Not on file    Minutes per session: Not on file  . Stress: Not on file  Relationships  . Social connections:    Talks on phone: Not on file    Gets together: Not on file    Attends religious service: Not on file    Active member of club or organization: Not on file    Attends meetings of clubs or organizations: Not on file    Relationship status: Not on file  Other Topics Concern  . Not on file  Social History Narrative  . Not on file   Past Surgical History:  Procedure Laterality Date  . ANKLE SURGERY    . ASPIRATION OF ABSCESS    . CHOLECYSTECTOMY    . DE QUERVAIN'S RELEASE    . grastricbypas    . HERNIA REPAIR    . NASAL SEPTUM SURGERY    . TOTAL VAGINAL HYSTERECTOMY    . TUBAL LIGATION  No past medical history on file. Ht 5' (1.524 m)   Wt 191 lb (86.6 kg)   BMI 37.30 kg/m   Opioid Risk Score:   Fall Risk Score:  `1  Depression screen PHQ 2/9  [facility]/15/2018 07/05/2018 04/19/2018 02/21/2018 12/26/2017 07/12/2017 03/11/2017  Decreased Interest 1 1 1 1 1 1 1   Down, Depressed, Hopeless 1 1 1 1 1 1 1   PHQ - 2 Score 2 2 2 2 2 2 2   Altered sleeping - - - - - - -  Tired, decreased energy - - - - - - -  Change in appetite - - - - - - -  Feeling bad or failure about yourself  - - - - - - -  Trouble concentrating - - - - - - -  Moving slowly or fidgety/restless - - - - - - -  Suicidal thoughts - - - - - - -  PHQ-9 Score - - - - - - -  Difficult doing work/chores - - - - - - -    Review of Systems  Constitutional: Negative.   HENT: Negative.   Eyes:  Negative.   Respiratory: Negative.   Cardiovascular: Negative.   Gastrointestinal: Negative.   Endocrine: Negative.   Genitourinary: Negative.   Musculoskeletal: Positive for back pain.  Skin: Negative.   Allergic/Immunologic: Negative.   Neurological: Positive for tremors, weakness and numbness.  Hematological: Negative.   Psychiatric/Behavioral: Positive for dysphoric mood. The patient is nervous/anxious.   All other systems reviewed and are negative.      Objective:   Physical Exam Vitals signs and nursing note reviewed.  Musculoskeletal:     Comments: No physical exam performed: virtual visit  Neurological:     Mental Status: She is oriented to person, place, and time.           Assessment & Plan:  1. Chronic low back pain with substantial spondylosis and scoliosis by MRI per Dr. Riley KillSwartz note.08/15/2018. Refilled: Hydrocodone 7.5/325 mg one tablet every 8 hours as needed #90.  2. Lumbar Facet Arthropathy/ SI Joint Dysfunction: Continue with Facet Stretches: S/PMBB on 07/12/2017. 08/15/2018.. 3. Fibromyalgia:ContinueGabapentin and HEP as Tolerated.08/15/2018. 4.Right Greater Trochanter Bursitis: Continue to Alternate with Ice and Heat Therapy.02/21/2018  Telephone Cal  Location of patient: In her Home Location of provider: Office Established patient Time spent on call:10 Minutes

## 2018-08-16 ENCOUNTER — Encounter: Payer: Self-pay | Admitting: Registered Nurse

## 2018-10-16 ENCOUNTER — Telehealth: Payer: Self-pay

## 2018-10-16 NOTE — Telephone Encounter (Signed)
Patient called stating that she has an appt on 10/27/2018 but only has #14 pills left. Need enough to get her to her appt. She states she was told to call if will run out before appt.

## 2018-10-17 NOTE — Telephone Encounter (Signed)
PMP was reviewed, last hydrocodone prescription filled on 09/06/2018. Hydrocodone e-scribed.. Placed a call to Ms. Casino regarding the above, left message to call office.

## 2018-10-19 ENCOUNTER — Telehealth: Payer: Self-pay | Admitting: Registered Nurse

## 2018-10-19 ENCOUNTER — Other Ambulatory Visit: Payer: Self-pay | Admitting: Registered Nurse

## 2018-10-19 DIAGNOSIS — M48061 Spinal stenosis, lumbar region without neurogenic claudication: Secondary | ICD-10-CM

## 2018-10-19 DIAGNOSIS — M47816 Spondylosis without myelopathy or radiculopathy, lumbar region: Secondary | ICD-10-CM

## 2018-10-19 DIAGNOSIS — M5416 Radiculopathy, lumbar region: Secondary | ICD-10-CM

## 2018-10-19 DIAGNOSIS — M4126 Other idiopathic scoliosis, lumbar region: Secondary | ICD-10-CM

## 2018-10-19 DIAGNOSIS — M546 Pain in thoracic spine: Secondary | ICD-10-CM

## 2018-10-19 MED ORDER — HYDROCODONE-ACETAMINOPHEN 7.5-325 MG PO TABS: 1.0000 | ORAL_TABLET | Freq: Three times a day (TID) | ORAL | 0 refills | Status: DC | PRN

## 2018-10-19 NOTE — Telephone Encounter (Signed)
Hydrocodone e-scribed. Leah Barton is aware

## 2018-10-27 ENCOUNTER — Encounter: Payer: Medicare Other | Admitting: Registered Nurse

## 2018-11-10 ENCOUNTER — Encounter: Payer: Medicare Other | Attending: Physical Medicine & Rehabilitation | Admitting: Registered Nurse

## 2018-11-10 ENCOUNTER — Encounter: Payer: Self-pay | Admitting: Registered Nurse

## 2018-11-10 ENCOUNTER — Other Ambulatory Visit: Payer: Self-pay

## 2018-11-10 VITALS — BP 122/69 | HR 69 | Temp 97.4°F | Ht 60.0 in | Wt 202.0 lb

## 2018-11-10 DIAGNOSIS — Z5181 Encounter for therapeutic drug level monitoring: Secondary | ICD-10-CM | POA: Diagnosis not present

## 2018-11-10 DIAGNOSIS — M545 Low back pain, unspecified: Secondary | ICD-10-CM

## 2018-11-10 DIAGNOSIS — Z79899 Other long term (current) drug therapy: Secondary | ICD-10-CM | POA: Insufficient documentation

## 2018-11-10 DIAGNOSIS — G894 Chronic pain syndrome: Secondary | ICD-10-CM

## 2018-11-10 DIAGNOSIS — M48061 Spinal stenosis, lumbar region without neurogenic claudication: Secondary | ICD-10-CM

## 2018-11-10 DIAGNOSIS — Z9889 Other specified postprocedural states: Secondary | ICD-10-CM | POA: Diagnosis not present

## 2018-11-10 DIAGNOSIS — Z9049 Acquired absence of other specified parts of digestive tract: Secondary | ICD-10-CM | POA: Insufficient documentation

## 2018-11-10 DIAGNOSIS — Z79891 Long term (current) use of opiate analgesic: Secondary | ICD-10-CM

## 2018-11-10 DIAGNOSIS — M542 Cervicalgia: Secondary | ICD-10-CM | POA: Insufficient documentation

## 2018-11-10 DIAGNOSIS — M47816 Spondylosis without myelopathy or radiculopathy, lumbar region: Secondary | ICD-10-CM

## 2018-11-10 DIAGNOSIS — G8929 Other chronic pain: Secondary | ICD-10-CM | POA: Diagnosis present

## 2018-11-10 DIAGNOSIS — M25511 Pain in right shoulder: Secondary | ICD-10-CM

## 2018-11-10 DIAGNOSIS — M797 Fibromyalgia: Secondary | ICD-10-CM | POA: Diagnosis not present

## 2018-11-10 DIAGNOSIS — Z90710 Acquired absence of both cervix and uterus: Secondary | ICD-10-CM | POA: Insufficient documentation

## 2018-11-10 DIAGNOSIS — M5416 Radiculopathy, lumbar region: Secondary | ICD-10-CM

## 2018-11-10 MED ORDER — METHYLPREDNISOLONE 4 MG PO TBPK: ORAL_TABLET | ORAL | 0 refills | Status: DC

## 2018-11-10 MED ORDER — GABAPENTIN 300 MG PO CAPS: ORAL_CAPSULE | ORAL | 2 refills | Status: DC

## 2018-11-10 NOTE — Patient Instructions (Signed)
Increase You're Hydrocodone to every 6 hours as needed for Pain.   Call Office on Tuesday for Medication Evaluation: 11/14/2018.

## 2018-11-10 NOTE — Progress Notes (Signed)
Subjective:    Patient ID: Leah Barton, female    DOB: [birth year], 68 y.o.   MRN: [id number]  HPI: Leah Barton is a 68 y.o. female who returns for follow up appointment for chronic pain and medication refill. She states her pain is located in her right shoulder, mid- lower back pain radiating into her bilateral lower extremities. Also reports she is experiencing increase lower back pain since a fall over a week ago. Educated on falls prevention, she verbalizes understanding. She rates her  Pain 7. Her current exercise regime is walking.  [name] Morphine equivalent is 22.50  MME. She is also prescribed Clonazepam  by Dr. [name].We have discussed the black box warning of using opioids and benzodiazepines. I highlighted the dangers of using these drugs together and discussed the adverse events including respiratory suppression, overdose, cognitive impairment and importance of compliance with current regimen. We will continue to monitor and adjust as indicated.   UDS ordered today.   Pain Inventory Average Pain 4 Pain Right Now 7 My pain is sharp, burning, dull, stabbing, tingling and aching  In the last 24 hours, has pain interfered with the following? General activity 7 Relation with others 7 Enjoyment of life 7 What TIME of day is your pain at its worst? all Sleep (in general) Fair  Pain is worse with: walking, bending, sitting, standing and some activites Pain improves with: medication Relief from Meds: 2  Mobility use a cane how many minutes can you walk? 5 ability to climb steps?  yes do you drive?  yes  Function retired  Neuro/Psych weakness numbness tremor tingling trouble walking spasms dizziness confusion depression anxiety  Prior Studies Any changes since last visit?  no  Physicians involved in your care Any changes since last visit?  no   No family history on file. Social History   Socioeconomic History  . Marital status: Married    Spouse  name: Not on file  . Number of children: Not on file  . Years of education: Not on file  . Highest education level: Not on file  Occupational History  . Not on file  Social Needs  . Financial resource strain: Not on file  . Food insecurity    Worry: Not on file    Inability: Not on file  . Transportation needs    Medical: Not on file    Non-medical: Not on file  Tobacco Use  . Smoking status: Never Smoker  . Smokeless tobacco: Never Used  Substance and Sexual Activity  . Alcohol use: No  . Drug use: No  . Sexual activity: Not on file  Lifestyle  . Physical activity    Days per week: Not on file    Minutes per session: Not on file  . Stress: Not on file  Relationships  . Social Musician on phone: Not on file    Gets together: Not on file    Attends religious service: Not on file    Active member of club or organization: Not on file    Attends meetings of clubs or organizations: Not on file    Relationship status: Not on file  Other Topics Concern  . Not on file  Social History Narrative  . Not on file   Past Surgical History:  Procedure Laterality Date  . ANKLE SURGERY    . ASPIRATION OF ABSCESS    . CHOLECYSTECTOMY    . DE QUERVAIN'S RELEASE    . grastricbypas    .  HERNIA REPAIR    . NASAL SEPTUM SURGERY    . TOTAL VAGINAL HYSTERECTOMY    . TUBAL LIGATION     No past medical history on file. BP 93/63   Pulse 69   Temp (!) 97.4 F (36.3 C)   Ht 5' (1.524 m)   Wt 202 lb (91.6 kg)   SpO2 95%   BMI 39.45 kg/m   Opioid Risk Score:   Fall Risk Score:  `1  Depression screen PHQ 2/9  [facility] 08/15/2018 07/05/2018 04/19/2018 02/21/2018 12/26/2017 07/12/2017 03/11/2017  Decreased Interest 1 1 1 1 1 1 1   Down, Depressed, Hopeless 1 1 1 1 1 1 1   PHQ - 2 Score 2 2 2 2 2 2 2   Altered sleeping - - - - - - -  Tired, decreased energy - - - - - - -  Change in appetite - - - - - - -  Feeling bad or failure about yourself  - - - - - - -   Trouble concentrating - - - - - - -  Moving slowly or fidgety/restless - - - - - - -  Suicidal thoughts - - - - - - -  PHQ-9 Score - - - - - - -  Difficult doing work/chores - - - - - - -    Review of Systems  Constitutional: Negative.   HENT: Negative.   Eyes: Negative.   Respiratory: Negative.   Cardiovascular: Negative.   Gastrointestinal: Negative.   Endocrine: Negative.   Genitourinary: Negative.   Musculoskeletal: Positive for gait problem.  Allergic/Immunologic: Negative.   Neurological: Positive for weakness and numbness.  Hematological: Negative.   Psychiatric/Behavioral: Negative.   All other systems reviewed and are negative.      Objective:   Physical Exam Vitals signs and nursing note reviewed.  Constitutional:      Appearance: Normal appearance.  Neck:     Musculoskeletal: Normal range of motion and neck supple.  Cardiovascular:     Rate and Rhythm: Normal rate and regular rhythm.     Pulses: Normal pulses.     Heart sounds: Normal heart sounds.  Pulmonary:     Effort: Pulmonary effort is normal.     Breath sounds: Normal breath sounds.  Musculoskeletal:     Comments: Normal Muscle Bulk and Muscle Testing Reveals:  Upper Extremities: Full ROM and Muscle Strength 5/5 Right AC Joint Tenderness  Thoracic Paraspinal Tenderness: T-7-T-9  Lumbar Hypersensitivity Lower Extremities : Full ROM and Muscle Strength 5/5 Arises from Table Slowly Antalgic Gait   Neurological:     Mental Status: She is alert and oriented to person, place, and time.  Psychiatric:        Mood and Affect: Mood normal.        Behavior: Behavior normal.           Assessment & Plan:  1. Acute Exacerbation of Chronic Low Back Pain: RX: Medrol dose Pak. Increase Hydrocodone to every 6 hours as needed for pain. Call office on 11/14/2018 to evaluate medication regimen. She verbalizes understanding.  2. Chronic low back pain with substantial spondylosis and scoliosis by MRI per Dr.  Riley KillSwartz note.11/10/2018. Continue: Hydrocodone 7.5/325 mg one tablet every 6 hours as needed. 2. Lumbar Facet Arthropathy/ SI Joint Dysfunction: Continue with Facet Stretches: S/PMBB on 07/12/2017. 11/10/2018.. 3. Fibromyalgia:ContinueGabapentin and HEP as Tolerated.11/10/2018. 4.RightGreater Trochanter Bursitis: No complaints today.Continue to Alternate with Ice and Heat Therapy.02/21/2018 5. Chronic Right Shoulder Pain:  Continue HEP as Tolerated. Continue to Monitor.  6. Lumbar Radiculitis: Continue Gabapentin. Continue to Monitor.   20 minutes of face to face patient care time was spent during this visit. All questions were encouraged and answered.  F/U in 2 months

## 2018-11-14 ENCOUNTER — Telehealth: Payer: Self-pay | Admitting: Physical Medicine & Rehabilitation

## 2018-11-14 NOTE — Telephone Encounter (Signed)
Patient saw Leah Barton last week and would like a call back from her.  Medication isn't helping her and she is still in right much pain.

## 2018-11-15 MED ORDER — OXYCODONE HCL 5 MG PO TABS: 5.0000 mg | ORAL_TABLET | Freq: Three times a day (TID) | ORAL | 0 refills | Status: DC | PRN

## 2018-11-15 NOTE — Telephone Encounter (Signed)
Return Ms. Mani call, she reports she's still having  Mid- back pain, no relief with increase of hydrocodone. We will prescribe Oxycodone and discontinue the hydrocodone, she verbalizes understanding. She was instructed to call office or My chart message on Tuesday 11/21/2018, she verbalizes understanding. She is aware to discontinue the Hydrocodone.

## 2018-11-16 LAB — TOXASSURE SELECT,+ANTIDEPR,UR

## 2018-11-17 ENCOUNTER — Telehealth: Payer: Self-pay | Admitting: *Deleted

## 2018-11-17 NOTE — Telephone Encounter (Signed)
Urine drug screen for this encounter is consistent for prescribed medication 

## 2018-11-22 NOTE — Telephone Encounter (Signed)
Patient called back, stated that the new medication is not making her feel any better and she dose not like how the new medication makes her feel.

## 2018-11-23 NOTE — Telephone Encounter (Signed)
Placed a call to [name] , she states she's having mid- back pain mainly left side. Also reports her pain is easing off. [name]  states she's  experiencing mild  SOB for the last two weeks, she was instructed to go the Emergency Room, she refused. She was instructed to call her PCP, she states she called her PCP they aren't seeing sick patients at this time. Also was instructed to go to ED. Reiterated to [name] the importance of seeking medical care, she verbalizes understanding and refuses to go to emergency room again.

## 2018-11-27 ENCOUNTER — Telehealth: Payer: Self-pay

## 2018-11-27 MED ORDER — HYDROCODONE-ACETAMINOPHEN 7.5-325 MG PO TABS: 1.0000 | ORAL_TABLET | Freq: Three times a day (TID) | ORAL | 0 refills | Status: DC | PRN

## 2018-11-27 NOTE — Telephone Encounter (Signed)
Patient called stated she went to a clinic in [place] and got checked out on Friday and they didn't find nothing. Pain has decreased so she has only been taking the Hydrocodone instead of Oxycodone and needs a refill on Hydrocodone. Last filled Hydrocodone #90 on 10/19/2018 and Oxycodone #75 on 11/15/2018. Next appt 01/10/2019

## 2018-11-27 NOTE — Telephone Encounter (Signed)
PMP was Reviewed.  Return Leah Barton call, she has 59 tablets of the Oxycodone. She states her pain is being controlled with the Hydrocodone at this time. Her Oxycodone will be discontinued, she was instructed to bring the Oxycodone on her next schedule visit. She verbalizes understanding.

## 2018-12-01 ENCOUNTER — Other Ambulatory Visit: Payer: Self-pay | Admitting: Physical Medicine & Rehabilitation

## 2018-12-06 ENCOUNTER — Telehealth: Payer: Self-pay

## 2018-12-06 MED ORDER — GABAPENTIN 300 MG PO CAPS: ORAL_CAPSULE | ORAL | 2 refills | Status: DC

## 2018-12-06 NOTE — Telephone Encounter (Signed)
Refill request for Gabapentin. done

## 2018-12-25 ENCOUNTER — Telehealth: Payer: Self-pay

## 2018-12-25 MED ORDER — HYDROCODONE-ACETAMINOPHEN 7.5-325 MG PO TABS: 1.0000 | ORAL_TABLET | Freq: Three times a day (TID) | ORAL | 0 refills | Status: DC | PRN

## 2018-12-25 NOTE — Telephone Encounter (Signed)
Patient called in a refill of hydrocodone, last fill was 11-27-2018, next appointment on 01-10-2019

## 2018-12-25 NOTE — Telephone Encounter (Signed)
PMP was reviewed. Placed a call to Ms. Lown regarding her Hydrocodone. Hydrocodone e-scribed, she verbalizes understanding.

## 2019-01-10 ENCOUNTER — Encounter: Payer: Self-pay | Admitting: Registered Nurse

## 2019-01-10 ENCOUNTER — Other Ambulatory Visit: Payer: Self-pay

## 2019-01-10 ENCOUNTER — Encounter: Payer: Medicare Other | Attending: Physical Medicine & Rehabilitation | Admitting: Registered Nurse

## 2019-01-10 VITALS — Ht 59.0 in | Wt 200.0 lb

## 2019-01-10 DIAGNOSIS — M48061 Spinal stenosis, lumbar region without neurogenic claudication: Secondary | ICD-10-CM | POA: Diagnosis not present

## 2019-01-10 DIAGNOSIS — Z90710 Acquired absence of both cervix and uterus: Secondary | ICD-10-CM | POA: Insufficient documentation

## 2019-01-10 DIAGNOSIS — M542 Cervicalgia: Secondary | ICD-10-CM | POA: Insufficient documentation

## 2019-01-10 DIAGNOSIS — Z79891 Long term (current) use of opiate analgesic: Secondary | ICD-10-CM

## 2019-01-10 DIAGNOSIS — Z5181 Encounter for therapeutic drug level monitoring: Secondary | ICD-10-CM

## 2019-01-10 DIAGNOSIS — M797 Fibromyalgia: Secondary | ICD-10-CM | POA: Diagnosis not present

## 2019-01-10 DIAGNOSIS — M25511 Pain in right shoulder: Secondary | ICD-10-CM | POA: Insufficient documentation

## 2019-01-10 DIAGNOSIS — M5416 Radiculopathy, lumbar region: Secondary | ICD-10-CM

## 2019-01-10 DIAGNOSIS — M47816 Spondylosis without myelopathy or radiculopathy, lumbar region: Secondary | ICD-10-CM | POA: Diagnosis not present

## 2019-01-10 DIAGNOSIS — Z9049 Acquired absence of other specified parts of digestive tract: Secondary | ICD-10-CM | POA: Insufficient documentation

## 2019-01-10 DIAGNOSIS — G894 Chronic pain syndrome: Secondary | ICD-10-CM

## 2019-01-10 DIAGNOSIS — G8929 Other chronic pain: Secondary | ICD-10-CM | POA: Insufficient documentation

## 2019-01-10 DIAGNOSIS — Z79899 Other long term (current) drug therapy: Secondary | ICD-10-CM | POA: Insufficient documentation

## 2019-01-10 DIAGNOSIS — Z9889 Other specified postprocedural states: Secondary | ICD-10-CM | POA: Insufficient documentation

## 2019-01-10 MED ORDER — HYDROCODONE-ACETAMINOPHEN 7.5-325 MG PO TABS: 1.0000 | ORAL_TABLET | Freq: Three times a day (TID) | ORAL | 0 refills | Status: DC | PRN

## 2019-01-10 NOTE — Progress Notes (Signed)
Subjective:    Patient ID: Leah Barton, female    DOB: September 27, 1950, 68 y.o.   MRN: [id number]  HPI: Leah Barton is a 68 y.o. female whose appointment was changed to tele- health visit she called office stating she was having diarrhea. [name] agrees to tele-health visit. She  states her pain is located in her lower back radiating into her right hip and reports she's having generalized fibro pain. She rates her pain 5. Her current exercise regime is walking.  [name] Morphine equivalent is 22.50  MME. She  is also prescribed Clonazepam by Dr. [name]. We have discussed the black box warning of using opioids and benzodiazepines. I highlighted the dangers of using these drugs together and discussed the adverse events including respiratory suppression, overdose, cognitive impairment and importance of compliance with current regimen. We will continue to monitor and adjust as indicated.   Last UDS was Performed on 11/10/2018, it was consistent.   Angela Nevin CMA asked the Health and History Questions, this provider and Angela Nevin used two identifiers to verified we were speaking with the correct person.    Pain Inventory Average Pain 4 Pain Right Now 5 My pain is constant, sharp, burning, stabbing and aching  In the last 24 hours, has pain interfered with the following? General activity 10 Relation with others 10 Enjoyment of life 10 What TIME of day is your pain at its worst? varies Sleep (in general) Fair  Pain is worse with: walking, bending, sitting, inactivity, standing and some activites Pain improves with: heat/ice and medication Relief from Meds: varies  Mobility walk without assistance walk with assistance use a cane  Function disabled: date disabled .  Neuro/Psych bladder control problems bowel control problems weakness numbness tremor tingling trouble walking spasms dizziness confusion depression anxiety  Prior Studies Any changes since  last visit?  no  Physicians involved in your care Any changes since last visit?  no   History reviewed. No pertinent family history. Social History   Socioeconomic History  . Marital status: Married    Spouse name: Not on file  . Number of children: Not on file  . Years of education: Not on file  . Highest education level: Not on file  Occupational History  . Not on file  Social Needs  . Financial resource strain: Not on file  . Food insecurity    Worry: Not on file    Inability: Not on file  . Transportation needs    Medical: Not on file    Non-medical: Not on file  Tobacco Use  . Smoking status: Never Smoker  . Smokeless tobacco: Never Used  Substance and Sexual Activity  . Alcohol use: No  . Drug use: No  . Sexual activity: Not on file  Lifestyle  . Physical activity    Days per week: Not on file    Minutes per session: Not on file  . Stress: Not on file  Relationships  . Social Musician on phone: Not on file    Gets together: Not on file    Attends religious service: Not on file    Active member of club or organization: Not on file    Attends meetings of clubs or organizations: Not on file    Relationship status: Not on file  Other Topics Concern  . Not on file  Social History Narrative  . Not on file   Past Surgical History:  Procedure Laterality Date  . ANKLE  SURGERY    . ASPIRATION OF ABSCESS    . CHOLECYSTECTOMY    . DE QUERVAIN'S RELEASE    . grastricbypas    . HERNIA REPAIR    . NASAL SEPTUM SURGERY    . TOTAL VAGINAL HYSTERECTOMY    . TUBAL LIGATION     History reviewed. No pertinent past medical history. Ht 4\' 11"  (1.499 m) Comment: 59in  Wt 200 lb (90.7 kg) Comment: patient reported  BMI 40.40 kg/m   Opioid Risk Score:   Fall Risk Score:  `1  Depression screen PHQ 2/9  Depression screen [facility] 2/9 08/15/2018 07/05/2018 04/19/2018 02/21/2018 12/26/2017 07/12/2017 03/11/2017  Decreased Interest 1 1 1 1 1 1 1   Down, Depressed,  Hopeless 1 1 1 1 1 1 1   PHQ - 2 Score 2 2 2 2 2 2 2   Altered sleeping - - - - - - -  Tired, decreased energy - - - - - - -  Change in appetite - - - - - - -  Feeling bad or failure about yourself  - - - - - - -  Trouble concentrating - - - - - - -  Moving slowly or fidgety/restless - - - - - - -  Suicidal thoughts - - - - - - -  PHQ-9 Score - - - - - - -  Difficult doing work/chores - - - - - - -    Review of Systems  Constitutional: Negative.   HENT: Negative.   Respiratory: Negative.   Gastrointestinal: Positive for diarrhea.  Endocrine: Negative.   Genitourinary: Positive for difficulty urinating.  Musculoskeletal: Positive for arthralgias, back pain, gait problem and myalgias.  Skin: Negative.   Allergic/Immunologic: Negative.   Neurological: Positive for tremors, weakness and numbness.  Psychiatric/Behavioral: Positive for confusion and dysphoric mood. The patient is nervous/anxious.   All other systems reviewed and are negative.      Objective:   Physical Exam Vitals signs and nursing note reviewed.  Neurological:     Mental Status: She is oriented to person, place, and time.           Assessment & Plan:  1. Chronic low back pain with substantial spondylosis and scoliosis by MRI per Dr. Naaman Plummer note.01/10/2019. Refilled: Hydrocodone 7.5/325 mg one tablet every 8 hours as needed #90.  2. Lumbar Facet Arthropathy/ SI Joint Dysfunction: Continue with Facet Stretches: S/PMBB on 07/12/2017. 01/10/2019.. 3. Lumbar Radiculitis: Continue Gabapentin. Continue to Monitor. 01/10/2019 4. Fibromyalgia:ContinueGabapentin and HEP as Tolerated.01/10/2019. 5.RightGreater Trochanter Bursitis: Continue to Alternate with Ice and Heat Therapy.01/10/2019  Telephone Call  Location of patient: In her Home Location of provider: Office Established patient Time spent on call:10 Minutes

## 2019-02-14 ENCOUNTER — Telehealth: Payer: Self-pay

## 2019-02-14 MED ORDER — HYDROCODONE-ACETAMINOPHEN 7.5-325 MG PO TABS: 1.0000 | ORAL_TABLET | Freq: Three times a day (TID) | ORAL | 0 refills | Status: DC | PRN

## 2019-02-14 NOTE — Telephone Encounter (Signed)
Patient called stating she will be out of her Hydrocodone before her next appt. Last filled #90 on 01/23/2019 next appt 03/12/2019

## 2019-02-14 NOTE — Telephone Encounter (Signed)
Returned Leah Barton call, no answer, left lessage to return the call. PMP was reviewed and Hydrocodone was e-scribed.

## 2019-03-12 ENCOUNTER — Encounter: Payer: Medicare Other | Attending: Physical Medicine & Rehabilitation | Admitting: Registered Nurse

## 2019-03-12 ENCOUNTER — Encounter: Payer: Self-pay | Admitting: Registered Nurse

## 2019-03-12 ENCOUNTER — Other Ambulatory Visit: Payer: Self-pay

## 2019-03-12 VITALS — Ht 60.0 in | Wt 200.0 lb

## 2019-03-12 DIAGNOSIS — G894 Chronic pain syndrome: Secondary | ICD-10-CM

## 2019-03-12 DIAGNOSIS — M545 Low back pain: Secondary | ICD-10-CM | POA: Diagnosis not present

## 2019-03-12 DIAGNOSIS — M797 Fibromyalgia: Secondary | ICD-10-CM

## 2019-03-12 DIAGNOSIS — Z9889 Other specified postprocedural states: Secondary | ICD-10-CM | POA: Insufficient documentation

## 2019-03-12 DIAGNOSIS — Z90710 Acquired absence of both cervix and uterus: Secondary | ICD-10-CM | POA: Insufficient documentation

## 2019-03-12 DIAGNOSIS — M48061 Spinal stenosis, lumbar region without neurogenic claudication: Secondary | ICD-10-CM

## 2019-03-12 DIAGNOSIS — Z9049 Acquired absence of other specified parts of digestive tract: Secondary | ICD-10-CM | POA: Insufficient documentation

## 2019-03-12 DIAGNOSIS — G8929 Other chronic pain: Secondary | ICD-10-CM | POA: Insufficient documentation

## 2019-03-12 DIAGNOSIS — M419 Scoliosis, unspecified: Secondary | ICD-10-CM

## 2019-03-12 DIAGNOSIS — M5416 Radiculopathy, lumbar region: Secondary | ICD-10-CM

## 2019-03-12 DIAGNOSIS — Z5181 Encounter for therapeutic drug level monitoring: Secondary | ICD-10-CM

## 2019-03-12 DIAGNOSIS — Z79891 Long term (current) use of opiate analgesic: Secondary | ICD-10-CM

## 2019-03-12 DIAGNOSIS — M542 Cervicalgia: Secondary | ICD-10-CM | POA: Insufficient documentation

## 2019-03-12 DIAGNOSIS — M7061 Trochanteric bursitis, right hip: Secondary | ICD-10-CM

## 2019-03-12 DIAGNOSIS — M47816 Spondylosis without myelopathy or radiculopathy, lumbar region: Secondary | ICD-10-CM

## 2019-03-12 DIAGNOSIS — Z79899 Other long term (current) drug therapy: Secondary | ICD-10-CM | POA: Insufficient documentation

## 2019-03-12 DIAGNOSIS — M25511 Pain in right shoulder: Secondary | ICD-10-CM | POA: Insufficient documentation

## 2019-03-12 MED ORDER — HYDROCODONE-ACETAMINOPHEN 7.5-325 MG PO TABS: 1.0000 | ORAL_TABLET | Freq: Three times a day (TID) | ORAL | 0 refills | Status: DC | PRN

## 2019-03-12 NOTE — Progress Notes (Signed)
Subjective:    Patient ID: Leah Barton, female    DOB: [birth year], 68 y.o.   MRN: [id number]  HPI: Leah Barton is a 68 y.o. female whose appointment was changed to a virtual office visit to reduce the risk of exposure to the COVID-19 virus and to help Leah Barton remain healthy and safe. The virtual visit will also provide continuity of care. She agrees with virtual visit and verbalizes understanding. She states her pain is located in her lower back radiating into her right hip. She rates her pain 5. Her current exercise regime is walking and performing stretching exercises.  Leah Barton Morphine equivalent is 22.50  MME.  Last UDS was Performed on 11/10/2018, it was consistent.   Leah Barton CMA asked the Health and History Questions. This provider and Leah Barton verified we were speaking with the correct person using two identifiers.   Pain Inventory Average Pain 5 Pain Right Now 5 My pain is burning, dull and aching  In the last 24 hours, has pain interfered with the following? General activity 0 Relation with others 0 Enjoyment of life 0 What TIME of day is your pain at its worst? evening Sleep (in general) Fair  Pain is worse with: walking and some activites Pain improves with: heat/ice and TENS Relief from Meds: 5  Mobility use a cane ability to climb steps?  no do you drive?  yes  Function retired  Neuro/Psych bladder control problems weakness numbness tremor tingling trouble walking spasms dizziness confusion depression anxiety  Prior Studies Any changes since last visit?  no  Physicians involved in your care Any changes since last visit?  no   No family history on file. Social History   Socioeconomic History  . Marital status: Married    Spouse name: Not on file  . Number of children: Not on file  . Years of education: Not on file  . Highest education level: Not on file  Occupational History  . Not on file  Tobacco Use  . Smoking status:  Never Smoker  . Smokeless tobacco: Never Used  Substance and Sexual Activity  . Alcohol use: No  . Drug use: No  . Sexual activity: Not on file  Other Topics Concern  . Not on file  Social History Narrative  . Not on file   Social Determinants of Health   Financial Resource Strain:   . Difficulty of Paying Living Expenses: Not on file  Food Insecurity:   . Worried About Charity fundraiser in the Last Year: Not on file  . Ran Out of Food in the Last Year: Not on file  Transportation Needs:   . Lack of Transportation (Medical): Not on file  . Lack of Transportation (Non-Medical): Not on file  Physical Activity:   . Days of Exercise per Week: Not on file  . Minutes of Exercise per Session: Not on file  Stress:   . Feeling of Stress : Not on file  Social Connections:   . Frequency of Communication with Friends and Family: Not on file  . Frequency of Social Gatherings with Friends and Family: Not on file  . Attends Religious Services: Not on file  . Active Member of Clubs or Organizations: Not on file  . Attends Archivist Meetings: Not on file  . Marital Status: Not on file   Past Surgical History:  Procedure Laterality Date  . ANKLE SURGERY    . ASPIRATION OF ABSCESS    . CHOLECYSTECTOMY    .  DE QUERVAIN'S RELEASE    . grastricbypas    . HERNIA REPAIR    . NASAL SEPTUM SURGERY    . TOTAL VAGINAL HYSTERECTOMY    . TUBAL LIGATION     No past medical history on file. There were no vitals taken for this visit.  Opioid Risk Score:   Fall Risk Score:  `1  Depression screen PHQ 2/9  Depression screen [facility] 2/9 08/15/2018 07/05/2018 04/19/2018 02/21/2018 12/26/2017 07/12/2017 03/11/2017  Decreased Interest 1 1 1 1 1 1 1   Down, Depressed, Hopeless 1 1 1 1 1 1 1   PHQ - 2 Score 2 2 2 2 2 2 2   Altered sleeping - - - - - - -  Tired, decreased energy - - - - - - -  Change in appetite - - - - - - -  Feeling bad or failure about yourself  - - - - - - -  Trouble  concentrating - - - - - - -  Moving slowly or fidgety/restless - - - - - - -  Suicidal thoughts - - - - - - -  PHQ-9 Score - - - - - - -  Difficult doing work/chores - - - - - - -     Review of Systems  Constitutional: Negative.   HENT: Negative.   Eyes: Negative.   Respiratory: Negative.   Cardiovascular: Negative.   Gastrointestinal: Negative.   Endocrine: Negative.   Genitourinary: Negative.   Musculoskeletal: Positive for arthralgias, back pain, gait problem and myalgias.  Skin: Negative.   Allergic/Immunologic: Negative.   Neurological: Positive for dizziness, tremors, weakness and numbness.  Hematological: Negative.   Psychiatric/Behavioral: Negative.   All other systems reviewed and are negative.      Objective:   Physical Exam Vitals and nursing note reviewed.  Musculoskeletal:     Comments: No Physical Exam Performed: Virtual Visit           Assessment & Plan:  1. Chronic low back pain with substantial spondylosis and scoliosis by MRI per Dr. note.03/12/2019. Refilled: Hydrocodone 7.5/325 mg one tablet every 8 hours as needed #90. Second script sent for the following month. 2. Lumbar Facet Arthropathy/ SI Joint Dysfunction: Continue with Facet Stretches: S/PMBB on 07/12/2017. 03/12/2019.. 3. Fibromyalgia:ContinueGabapentin and HEP as Tolerated.03/12/2019. 4.RightGreater Trochanter Bursitis: Schedule for Right Hip Cortisone Injection with Dr 07/14/2017. Continue to Alternate with Ice and Heat Therapy.03/12/2019  Telephone Cal  Location of patient: In her Home Location of provider: Office Established patient Time spent on call:10 Minutes

## 2019-04-17 ENCOUNTER — Telehealth: Payer: Self-pay

## 2019-04-17 NOTE — Telephone Encounter (Signed)
Patient husband called on behave of patient requesting refill on Hydrocodone, last filled 03/22/2019 next appt 05/09/2019

## 2019-04-18 NOTE — Telephone Encounter (Signed)
Placed a call toi Leah Barton regarding her Hydrocodone prescription. Her Hydrocodone prescription was already sent to the pharmacy. This provider placed a call to Leah Barton regarding the above, she verbalizes understanding.

## 2019-05-09 ENCOUNTER — Other Ambulatory Visit: Payer: Self-pay

## 2019-05-09 ENCOUNTER — Encounter: Payer: Self-pay | Admitting: Physical Medicine & Rehabilitation

## 2019-05-09 ENCOUNTER — Encounter: Payer: Medicare Other | Attending: Physical Medicine & Rehabilitation | Admitting: Physical Medicine & Rehabilitation

## 2019-05-09 VITALS — BP 124/90 | HR 81 | Ht 64.0 in | Wt 200.0 lb

## 2019-05-09 DIAGNOSIS — Z9889 Other specified postprocedural states: Secondary | ICD-10-CM | POA: Diagnosis not present

## 2019-05-09 DIAGNOSIS — M797 Fibromyalgia: Secondary | ICD-10-CM | POA: Insufficient documentation

## 2019-05-09 DIAGNOSIS — Z90710 Acquired absence of both cervix and uterus: Secondary | ICD-10-CM | POA: Diagnosis not present

## 2019-05-09 DIAGNOSIS — M25511 Pain in right shoulder: Secondary | ICD-10-CM | POA: Diagnosis not present

## 2019-05-09 DIAGNOSIS — M7061 Trochanteric bursitis, right hip: Secondary | ICD-10-CM | POA: Diagnosis not present

## 2019-05-09 DIAGNOSIS — G8929 Other chronic pain: Secondary | ICD-10-CM | POA: Insufficient documentation

## 2019-05-09 DIAGNOSIS — Z79899 Other long term (current) drug therapy: Secondary | ICD-10-CM | POA: Insufficient documentation

## 2019-05-09 DIAGNOSIS — M7062 Trochanteric bursitis, left hip: Secondary | ICD-10-CM | POA: Diagnosis not present

## 2019-05-09 DIAGNOSIS — M47816 Spondylosis without myelopathy or radiculopathy, lumbar region: Secondary | ICD-10-CM | POA: Insufficient documentation

## 2019-05-09 DIAGNOSIS — M542 Cervicalgia: Secondary | ICD-10-CM | POA: Diagnosis not present

## 2019-05-09 DIAGNOSIS — M48061 Spinal stenosis, lumbar region without neurogenic claudication: Secondary | ICD-10-CM | POA: Diagnosis not present

## 2019-05-09 DIAGNOSIS — Z9049 Acquired absence of other specified parts of digestive tract: Secondary | ICD-10-CM | POA: Insufficient documentation

## 2019-05-09 DIAGNOSIS — M545 Low back pain: Secondary | ICD-10-CM | POA: Diagnosis present

## 2019-05-09 MED ORDER — HYDROCODONE-ACETAMINOPHEN 7.5-325 MG PO TABS: 1.0000 | ORAL_TABLET | Freq: Three times a day (TID) | ORAL | 0 refills | Status: DC | PRN

## 2019-05-09 NOTE — Progress Notes (Signed)
PROCEDURE NOTE  DIAGNOSIS:  Right greater troch bursitis  INTERVENTION:  Major joint injection     After informed consent and preparation of the skin with betadine and isopropyl alcohol, I injected 6mg  (1cc) of celestone and 4cc of 1% lidocaine around the right greater troch via lateral approach. Additionally, aspiration was performed prior to injection. The patient tolerated well, and no complications were encountered. Afterward the area was cleaned and dressed. Post- injection instructions were provided.      , MD, [facility] 05/09/2019

## 2019-05-09 NOTE — Patient Instructions (Signed)
PLEASE FEEL FREE TO CALL OUR OFFICE WITH ANY PROBLEMS OR QUESTIONS ([phone])      

## 2019-05-23 ENCOUNTER — Other Ambulatory Visit: Payer: Self-pay | Admitting: Registered Nurse

## 2019-05-23 ENCOUNTER — Other Ambulatory Visit: Payer: Self-pay | Admitting: *Deleted

## 2019-06-15 ENCOUNTER — Other Ambulatory Visit: Payer: Self-pay | Admitting: Physical Medicine & Rehabilitation

## 2019-06-18 NOTE — Telephone Encounter (Signed)
Patient left a message stating that she needs a refill on her hydrocodone.  Electronic request received as well. Dr. Riley Kill was on-call this weekend.  Routed to [employer], ANP

## 2019-06-19 NOTE — Telephone Encounter (Signed)
PMP was reviewed: Hydrocodone was prescribed by Dr Riley Kill today. Placed a call to Ms. Gruen she states she has the hydrocodone.

## 2019-06-29 ENCOUNTER — Other Ambulatory Visit: Payer: Self-pay

## 2019-06-29 ENCOUNTER — Encounter: Payer: Self-pay | Admitting: Registered Nurse

## 2019-06-29 ENCOUNTER — Encounter: Payer: Medicare Other | Attending: Physical Medicine & Rehabilitation | Admitting: Registered Nurse

## 2019-06-29 VITALS — BP 120/70 | HR 84 | Temp 98.7°F | Ht 64.0 in | Wt 205.0 lb

## 2019-06-29 DIAGNOSIS — Z5181 Encounter for therapeutic drug level monitoring: Secondary | ICD-10-CM

## 2019-06-29 DIAGNOSIS — Z9049 Acquired absence of other specified parts of digestive tract: Secondary | ICD-10-CM | POA: Diagnosis not present

## 2019-06-29 DIAGNOSIS — Z90710 Acquired absence of both cervix and uterus: Secondary | ICD-10-CM | POA: Insufficient documentation

## 2019-06-29 DIAGNOSIS — Z79899 Other long term (current) drug therapy: Secondary | ICD-10-CM | POA: Insufficient documentation

## 2019-06-29 DIAGNOSIS — G894 Chronic pain syndrome: Secondary | ICD-10-CM | POA: Insufficient documentation

## 2019-06-29 DIAGNOSIS — M48061 Spinal stenosis, lumbar region without neurogenic claudication: Secondary | ICD-10-CM | POA: Diagnosis not present

## 2019-06-29 DIAGNOSIS — M545 Low back pain: Secondary | ICD-10-CM | POA: Diagnosis present

## 2019-06-29 DIAGNOSIS — M7062 Trochanteric bursitis, left hip: Secondary | ICD-10-CM

## 2019-06-29 DIAGNOSIS — M5416 Radiculopathy, lumbar region: Secondary | ICD-10-CM

## 2019-06-29 DIAGNOSIS — G8929 Other chronic pain: Secondary | ICD-10-CM | POA: Insufficient documentation

## 2019-06-29 DIAGNOSIS — Z9889 Other specified postprocedural states: Secondary | ICD-10-CM | POA: Diagnosis not present

## 2019-06-29 DIAGNOSIS — M7061 Trochanteric bursitis, right hip: Secondary | ICD-10-CM

## 2019-06-29 DIAGNOSIS — Z79891 Long term (current) use of opiate analgesic: Secondary | ICD-10-CM | POA: Diagnosis not present

## 2019-06-29 DIAGNOSIS — M25511 Pain in right shoulder: Secondary | ICD-10-CM | POA: Diagnosis not present

## 2019-06-29 DIAGNOSIS — M47816 Spondylosis without myelopathy or radiculopathy, lumbar region: Secondary | ICD-10-CM | POA: Insufficient documentation

## 2019-06-29 DIAGNOSIS — M542 Cervicalgia: Secondary | ICD-10-CM | POA: Diagnosis not present

## 2019-06-29 DIAGNOSIS — M797 Fibromyalgia: Secondary | ICD-10-CM | POA: Insufficient documentation

## 2019-06-29 MED ORDER — HYDROCODONE-ACETAMINOPHEN 7.5-325 MG PO TABS: 1.0000 | ORAL_TABLET | Freq: Three times a day (TID) | ORAL | 0 refills | Status: DC | PRN

## 2019-06-29 NOTE — Progress Notes (Signed)
Subjective:    Patient ID: Leah Barton, female    DOB: May 07, 1950, 69 y.o.   MRN: [id number]  HPI: Leah Barton is a 69 y.o. female who returns for follow up appointment for chronic pain and medication refill. She states her pain is located in her lower back and bilateral hips R>L. She rates her pain 3. Her. current exercise regime is walking.  Leah Barton Morphine equivalent is 22.50  MME.  She  is also prescribed Clonazepam by Dr. [name]. We have discussed the black box warning of using opioids and benzodiazepines. I highlighted the dangers of using these drugs together and discussed the adverse events including respiratory suppression, overdose, cognitive impairment and importance of compliance with current regimen. We will continue to monitor and adjust as indicated.    Pain Inventory Average Pain 6 Pain Right Now 3 My pain is sharp, burning, dull, stabbing, tingling and aching  In the last 24 hours, has pain interfered with the following? General activity 3 Relation with others 3 Enjoyment of life 6 What TIME of day is your pain at its worst? all Sleep (in general) Fair  Pain is worse with: unsure Pain improves with: rest, heat/ice, therapy/exercise, pacing activities, medication and injections Relief from Meds: 5  Mobility use a cane how many minutes can you walk? 5 ability to climb steps?  yes do you drive?  yes  Function disabled: date disabled 10/2013  Neuro/Psych bladder control problems weakness numbness tremor tingling trouble walking dizziness confusion depression anxiety suicidal thoughts  Prior Studies Any changes since last visit?  no  Physicians involved in your care Primary care .   No family history on file. Social History   Socioeconomic History  . Marital status: Married    Spouse name: Not on file  . Number of children: Not on file  . Years of education: Not on file  . Highest education level: Not on file  Occupational History    . Not on file  Tobacco Use  . Smoking status: Never Smoker  . Smokeless tobacco: Never Used  Substance and Sexual Activity  . Alcohol use: No  . Drug use: No  . Sexual activity: Not on file  Other Topics Concern  . Not on file  Social History Narrative  . Not on file   Social Determinants of Health   Financial Resource Strain:   . Difficulty of Paying Living Expenses:   Food Insecurity:   . Worried About Programme researcher, broadcasting/film/video in the Last Year:   . Barista in the Last Year:   Transportation Needs:   . Freight forwarder (Medical):   Marland Kitchen Lack of Transportation (Non-Medical):   Physical Activity:   . Days of Exercise per Week:   . Minutes of Exercise per Session:   Stress:   . Feeling of Stress :   Social Connections:   . Frequency of Communication with Friends and Family:   . Frequency of Social Gatherings with Friends and Family:   . Attends Religious Services:   . Active Member of Clubs or Organizations:   . Attends Banker Meetings:   Marland Kitchen Marital Status:    Past Surgical History:  Procedure Laterality Date  . ANKLE SURGERY    . ASPIRATION OF ABSCESS    . CHOLECYSTECTOMY    . DE QUERVAIN'S RELEASE    . grastricbypas    . HERNIA REPAIR    . NASAL SEPTUM SURGERY    . TOTAL VAGINAL  HYSTERECTOMY    . TUBAL LIGATION     No past medical history on file. BP 120/70   Pulse 84   Temp 98.7 F (37.1 C)   Ht 5\' 4"  (1.626 m)   Wt 205 lb (93 kg)   SpO2 97%   BMI 35.19 kg/m   Opioid Risk Score:   Fall Risk Score:  `1  Depression screen PHQ 2/9  Depression screen [facility] 2/9 08/15/2018 07/05/2018 04/19/2018 02/21/2018 12/26/2017 07/12/2017 03/11/2017  Decreased Interest 1 1 1 1 1 1 1   Down, Depressed, Hopeless 1 1 1 1 1 1 1   PHQ - 2 Score 2 2 2 2 2 2 2   Altered sleeping - - - - - - -  Tired, decreased energy - - - - - - -  Change in appetite - - - - - - -  Feeling bad or failure about yourself  - - - - - - -  Trouble concentrating - - - - - - -   Moving slowly or fidgety/restless - - - - - - -  Suicidal thoughts - - - - - - -  PHQ-9 Score - - - - - - -  Difficult doing work/chores - - - - - - -    Review of Systems  Constitutional: Positive for unexpected weight change.  Gastrointestinal: Positive for abdominal pain.  Genitourinary: Positive for dysuria.  Musculoskeletal: Positive for gait problem.  Neurological: Positive for tremors, weakness and numbness.       Tingling  Psychiatric/Behavioral: Positive for confusion and dysphoric mood. The patient is nervous/anxious.   All other systems reviewed and are negative.      Objective:   Physical Exam Vitals and nursing note reviewed.  Constitutional:      Appearance: Normal appearance.  Cardiovascular:     Rate and Rhythm: Normal rate and regular rhythm.     Pulses: Normal pulses.     Heart sounds: Normal heart sounds.  Pulmonary:     Effort: Pulmonary effort is normal.     Breath sounds: Normal breath sounds.  Musculoskeletal:     Cervical back: Normal range of motion and neck supple.     Comments: Normal Muscle Bulk and Muscle Testing Reveals:  Upper Extremities: Full ROM and Muscle Strength 5/5  Bilateral Greater Trochanter Tenderness: R>L  Lower Extremities: Full ROM and Muscle Strength 5/5 Arises from chair slowly Narrow Based  Gait   Skin:    General: Skin is warm and dry.  Neurological:     Mental Status: She is alert and oriented to person, place, and time.  Psychiatric:        Mood and Affect: Mood normal.        Behavior: Behavior normal.           Assessment & Plan:  1. Chronic low back pain with substantial spondylosis and scoliosis by MRI per Dr. Naaman Plummer note.06/29/2019. Refilled: Hydrocodone 7.5/325 mg one tablet every 8 hours as needed #90. Second script sent for the following month. 2. Lumbar Facet Arthropathy/ SI Joint Dysfunction: Continue with Facet Stretches: S/PMBB on 07/12/2017.06/29/2019.. 3. Fibromyalgia:ContinueGabapentin  and HEP as Tolerated.06/29/2019. 4.Bilateral Greater Trochanter Bursitis: Continue to Alternate with Ice and Heat Therapy.06/29/2019  37minutes of face to face patient care time was spent during this visit. All questions were encouraged and answered.  F/U in 2 Months

## 2019-06-29 NOTE — Patient Instructions (Signed)
Please send Leah Barton a My chart regarding you're Pill count on August 14, 2019

## 2019-07-06 LAB — DRUG TOX MONITOR 1 W/CONF, ORAL FLD
Alprazolam: NEGATIVE ng/mL (ref <0.50)
Amobarbital: NEGATIVE ng/mL (ref <10)
Amphetamines: NEGATIVE ng/mL (ref <10)
Barbiturates: POSITIVE ng/mL — AB (ref <10)
Benzodiazepines: POSITIVE ng/mL — AB (ref <0.50)
Buprenorphine: NEGATIVE ng/mL (ref <0.10)
Butalbital: NEGATIVE ng/mL (ref <10)
Chlordiazepoxide: NEGATIVE ng/mL (ref <0.50)
Clonazepam: 0.51 ng/mL — ABNORMAL HIGH (ref <0.50)
Cocaine: NEGATIVE ng/mL (ref <5.0)
Codeine: NEGATIVE ng/mL (ref <2.5)
Diazepam: NEGATIVE ng/mL (ref <0.50)
Dihydrocodeine: 10.1 ng/mL — ABNORMAL HIGH (ref <2.5)
Fentanyl: NEGATIVE ng/mL (ref <0.10)
Flunitrazepam: NEGATIVE ng/mL (ref <0.50)
Flurazepam: NEGATIVE ng/mL (ref <0.50)
Heroin Metabolite: NEGATIVE ng/mL (ref <1.0)
Hydrocodone: 84.1 ng/mL — ABNORMAL HIGH (ref <2.5)
Hydromorphone: NEGATIVE ng/mL (ref <2.5)
Lorazepam: NEGATIVE ng/mL (ref <0.50)
MARIJUANA: NEGATIVE ng/mL (ref <2.5)
MDMA: NEGATIVE ng/mL (ref <10)
Meprobamate: NEGATIVE ng/mL (ref <2.5)
Methadone: NEGATIVE ng/mL (ref <5.0)
Midazolam: NEGATIVE ng/mL (ref <0.50)
Morphine: NEGATIVE ng/mL (ref <2.5)
Nicotine Metabolite: NEGATIVE ng/mL (ref <5.0)
Nordiazepam: NEGATIVE ng/mL (ref <0.50)
Norhydrocodone: 13.7 ng/mL — ABNORMAL HIGH (ref <2.5)
Noroxycodone: NEGATIVE ng/mL (ref <2.5)
Opiates: POSITIVE ng/mL — AB (ref <2.5)
Oxazepam: NEGATIVE ng/mL (ref <0.50)
Oxycodone: NEGATIVE ng/mL (ref <2.5)
Oxymorphone: NEGATIVE ng/mL (ref <2.5)
Pentobarbital: NEGATIVE ng/mL (ref <10)
Phencyclidine: NEGATIVE ng/mL (ref <10)
Phenobarbital: 121 ng/mL — ABNORMAL HIGH (ref <10)
Secobarbital: NEGATIVE ng/mL (ref <10)
Tapentadol: NEGATIVE ng/mL (ref <5.0)
Temazepam: NEGATIVE ng/mL (ref <0.50)
Tramadol: NEGATIVE ng/mL (ref <5.0)
Triazolam: NEGATIVE ng/mL (ref <0.50)
Zolpidem: NEGATIVE ng/mL (ref <5.0)

## 2019-07-06 LAB — DRUG TOX ALC METAB W/CON, ORAL FLD: Alcohol Metabolite: NEGATIVE ng/mL (ref <25)

## 2019-07-09 ENCOUNTER — Telehealth: Payer: Self-pay | Admitting: *Deleted

## 2019-07-09 NOTE — Telephone Encounter (Signed)
Urine drug screen for this encounter is consistent for prescribed medication 

## 2019-08-15 ENCOUNTER — Telehealth: Payer: Self-pay | Admitting: *Deleted

## 2019-08-15 NOTE — Telephone Encounter (Signed)
Patient left a message wanting to talk to [place].  She states she has 12 tablets of oxycodone left because she has had to take 3 a day nearly everyday (statement is confusing as she is prescribed 3 tabs a day #90). She is asking for a call back

## 2019-08-16 MED ORDER — HYDROCODONE-ACETAMINOPHEN 7.5-325 MG PO TABS: 1.0000 | ORAL_TABLET | Freq: Three times a day (TID) | ORAL | 0 refills | Status: DC | PRN

## 2019-08-16 NOTE — Telephone Encounter (Signed)
Placed a call to Ms. Charlyn Minerva, PMP was Reviewed. Hydrocodone was e-scribed. Placed a call to Ms Capelle regarding the above, also spoke with her to call the office herself when she needs a refill, not to have her husband send this provider a My-Chart message. She verbalizes understanding.

## 2019-08-27 ENCOUNTER — Ambulatory Visit: Payer: Medicare Other | Admitting: Registered Nurse

## 2019-08-28 ENCOUNTER — Encounter: Payer: Medicare Other | Attending: Physical Medicine & Rehabilitation | Admitting: Registered Nurse

## 2019-08-28 ENCOUNTER — Encounter: Payer: Self-pay | Admitting: Registered Nurse

## 2019-08-28 ENCOUNTER — Other Ambulatory Visit: Payer: Self-pay

## 2019-08-28 VITALS — BP 117/79 | HR 76 | Temp 96.3°F | Ht 64.0 in | Wt 205.8 lb

## 2019-08-28 DIAGNOSIS — Z90710 Acquired absence of both cervix and uterus: Secondary | ICD-10-CM | POA: Insufficient documentation

## 2019-08-28 DIAGNOSIS — M48061 Spinal stenosis, lumbar region without neurogenic claudication: Secondary | ICD-10-CM | POA: Diagnosis not present

## 2019-08-28 DIAGNOSIS — M797 Fibromyalgia: Secondary | ICD-10-CM | POA: Diagnosis not present

## 2019-08-28 DIAGNOSIS — G8929 Other chronic pain: Secondary | ICD-10-CM | POA: Insufficient documentation

## 2019-08-28 DIAGNOSIS — Z79899 Other long term (current) drug therapy: Secondary | ICD-10-CM | POA: Diagnosis not present

## 2019-08-28 DIAGNOSIS — Z9889 Other specified postprocedural states: Secondary | ICD-10-CM | POA: Insufficient documentation

## 2019-08-28 DIAGNOSIS — M542 Cervicalgia: Secondary | ICD-10-CM | POA: Diagnosis not present

## 2019-08-28 DIAGNOSIS — M25511 Pain in right shoulder: Secondary | ICD-10-CM | POA: Insufficient documentation

## 2019-08-28 DIAGNOSIS — F0631 Mood disorder due to known physiological condition with depressive features: Secondary | ICD-10-CM

## 2019-08-28 DIAGNOSIS — M545 Low back pain, unspecified: Secondary | ICD-10-CM

## 2019-08-28 DIAGNOSIS — M47816 Spondylosis without myelopathy or radiculopathy, lumbar region: Secondary | ICD-10-CM | POA: Insufficient documentation

## 2019-08-28 DIAGNOSIS — Z79891 Long term (current) use of opiate analgesic: Secondary | ICD-10-CM | POA: Diagnosis present

## 2019-08-28 DIAGNOSIS — G894 Chronic pain syndrome: Secondary | ICD-10-CM | POA: Insufficient documentation

## 2019-08-28 DIAGNOSIS — Z5181 Encounter for therapeutic drug level monitoring: Secondary | ICD-10-CM | POA: Insufficient documentation

## 2019-08-28 DIAGNOSIS — Z9049 Acquired absence of other specified parts of digestive tract: Secondary | ICD-10-CM | POA: Insufficient documentation

## 2019-08-28 DIAGNOSIS — M7062 Trochanteric bursitis, left hip: Secondary | ICD-10-CM

## 2019-08-28 DIAGNOSIS — M5416 Radiculopathy, lumbar region: Secondary | ICD-10-CM

## 2019-08-28 MED ORDER — HYDROCODONE-ACETAMINOPHEN 7.5-325 MG PO TABS: 1.0000 | ORAL_TABLET | Freq: Three times a day (TID) | ORAL | 0 refills | Status: DC | PRN

## 2019-08-28 NOTE — Progress Notes (Addendum)
Subjective:     Patient ID: Leah Barton, female   DOB: January 30, 1951, 69 y.o.   MRN: [id number]  HPI: Leah Barton is a 69 y.o. female who returns for follow up appointment for chronic pain and medication refill. She states her pain is located in her lower back radiating into her left buttock and left hip. She  rates her pain 6. Her current exercise regime is walking and performing stretching exercises.  Leah Barton checked suicidal thoughts, she states 10 days ago she was experiencing increase intensity of pain that was unbearable, this is why she checked the suicidal boxt. Also states she doesn't have a plan. She admits to being depressed and had a counselor in the past due to financial hardship she wasn't able to continue. She was instructed to call her counselor and schedule and appointment. She verbalizes understanding.   Spoke with Leah Barton in detail she states she doesn't have a suicidal plan and not having any suicidal thoughts at this time, the above was discussed with mananger Zorita Pang.   Leah Barton Morphine equivalent is 22.50 MME.  Last Oral Swab was Performed on 06/29/2019, it was consistent.   Pain Inventory Average Pain 5 Pain Right Now 6 My pain is constant, sharp, burning, dull, tingling and aching  In the last 24 hours, has pain interfered with the following? General activity 2 Relation with others 2 Enjoyment of life 2 What TIME of day is your pain at its worst? ALL DAY LONG Sleep (in general) Fair  Pain is worse with: walking, bending, sitting, inactivity, unsure and some activites Pain improves with: heat/ice, medication and injections Relief from Meds: 3  Mobility use a cane ability to climb steps?  no do you drive?  yes  Function disabled: date disabled 2015 I need assistance with the following:  household duties  Neuro/Psych weakness numbness tremor tingling trouble walking spasms dizziness confusion depression anxiety suicidal  thoughts  Prior Studies Any changes since last visit?  no  Physicians involved in your care Any changes since last visit?  no   No family history on file. Social History   Socioeconomic History  . Marital status: Married    Spouse name: Not on file  . Number of children: Not on file  . Years of education: Not on file  . Highest education level: Not on file  Occupational History  . Not on file  Tobacco Use  . Smoking status: Never Smoker  . Smokeless tobacco: Never Used  Substance and Sexual Activity  . Alcohol use: No  . Drug use: No  . Sexual activity: Not on file  Other Topics Concern  . Not on file  Social History Narrative  . Not on file   Social Determinants of Health   Financial Resource Strain:   . Difficulty of Paying Living Expenses:   Food Insecurity:   . Worried About Charity fundraiser in the Last Year:   . Arboriculturist in the Last Year:   Transportation Needs:   . Film/video editor (Medical):   Marland Kitchen Lack of Transportation (Non-Medical):   Physical Activity:   . Days of Exercise per Week:   . Minutes of Exercise per Session:   Stress:   . Feeling of Stress :   Social Connections:   . Frequency of Communication with Friends and Family:   . Frequency of Social Gatherings with Friends and Family:   . Attends Religious Services:   . Active Member of Clubs  or Organizations:   . Attends Banker Meetings:   Marland Kitchen Marital Status:    Past Surgical History:  Procedure Laterality Date  . ANKLE SURGERY    . ASPIRATION OF ABSCESS    . CHOLECYSTECTOMY    . DE QUERVAIN'S RELEASE    . grastricbypas    . HERNIA REPAIR    . NASAL SEPTUM SURGERY    . TOTAL VAGINAL HYSTERECTOMY    . TUBAL LIGATION     No past medical history on file. BP 117/79   Pulse 76   Temp (!) 96.3 F (35.7 C)   Ht 5\' 4"  (1.626 m)   Wt 205 lb 12.8 oz (93.4 kg)   SpO2 96%   BMI 35.33 kg/m   Opioid Risk Score:   Fall Risk Score:  `1  Depression screen PHQ  2/9  Depression screen [facility] 2/9 06/29/2019 08/15/2018 07/05/2018 04/19/2018 02/21/2018 12/26/2017 07/12/2017  Decreased Interest 3 1 1 1 1 1 1   Down, Depressed, Hopeless 3 1 1 1 1 1 1   PHQ - 2 Score 6 2 2 2 2 2 2   Altered sleeping - - - - - - -  Tired, decreased energy - - - - - - -  Change in appetite - - - - - - -  Feeling bad or failure about yourself  - - - - - - -  Trouble concentrating - - - - - - -  Moving slowly or fidgety/restless - - - - - - -  Suicidal thoughts 1 - - - - - -  PHQ-9 Score - - - - - - -  Difficult doing work/chores - - - - - - -   Review of Systems  Constitutional: Negative.   HENT: Negative.   Eyes:       Left eye decrease vision  Respiratory: Negative.   Cardiovascular: Negative.   Gastrointestinal: Negative.   Endocrine: Negative.   Genitourinary: Positive for dysuria.  Musculoskeletal: Positive for gait problem.       Spasms  Skin: Negative.   Allergic/Immunologic: Negative.   Neurological: Positive for tremors, weakness, numbness and headaches.       Tingling  Psychiatric/Behavioral: Positive for suicidal ideas.       Depression, Anxiety, Suicidial thoughts       Objective:   Physical Exam Vitals and nursing note reviewed.  Constitutional:      Appearance: Normal appearance.  Cardiovascular:     Rate and Rhythm: Normal rate and regular rhythm.     Pulses: Normal pulses.     Heart sounds: Normal heart sounds.  Pulmonary:     Effort: Pulmonary effort is normal.     Breath sounds: Normal breath sounds.  Musculoskeletal:     Cervical back: Normal range of motion and neck supple.     Comments: Normal Muscle Bulk and Muscle Testing Reveals:  Upper Extremities: Full ROM and Muscle Strength 5/5 Lumbar Paraspinal Tenderness: L-3-L-5 Left Greater Trochanteric Tenderness Lower Extremities: Full ROM and Muscle Strength 5/5 Arises from Table Slowly using cane Antalgic Gait   Skin:    General: Skin is warm.  Neurological:     Mental Status: She is  alert and oriented to person, place, and time.  Psychiatric:        Mood and Affect: Mood normal.        Behavior: Behavior normal.        Assessment: Plan   1. Acute exacerbation of Chronic Low Back Pain: Schedule Appointment  with Dr Wynn Banker for evaluation of an injection.08/28/2019 2.Chronic low back pain with substantial spondylosis and scoliosis by MRI per Dr. Riley Kill note.06/152021. Refilled: Hydrocodone 7.5/325 mg one tablet every 8 hours as needed #90.Second script sent for the following month. 3. Lumbar Facet Arthropathy/ SI Joint Dysfunction: Continue with Facet Stretches: S/PMBB on 07/12/2017.08/28/2019.. 4. Fibromyalgia:ContinueGabapentin and HEP as Tolerated.08/28/2019. 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 She was instructed to call her counselor to schedule an appointment. She verbalizes understanding. E will continue to monitor.   20 minutes of face to face patient care time was spent during this visit. All questions were encouraged and answered.  F/U in 2 Months

## 2019-09-04 ENCOUNTER — Telehealth: Payer: Self-pay | Admitting: Registered Nurse

## 2019-09-04 MED ORDER — METHYLPREDNISOLONE 4 MG PO TBPK: ORAL_TABLET | ORAL | 0 refills | Status: DC

## 2019-09-04 NOTE — Telephone Encounter (Signed)
Received a My-Chart Message from Leah Barton with increase intensity of lower back pain, will prescribed Medrol Dose Pak today. Ms. Brune is aware of the above via My-Chart. Ms. Hauth also has an appointment scheduled with Dr Wynn Banker for evaluation for injection, next month.

## 2019-09-20 ENCOUNTER — Ambulatory Visit: Payer: Medicare Other | Admitting: Physical Medicine & Rehabilitation

## 2019-10-23 ENCOUNTER — Other Ambulatory Visit: Payer: Self-pay

## 2019-10-23 ENCOUNTER — Encounter: Payer: Medicare Other | Attending: Physical Medicine & Rehabilitation | Admitting: Registered Nurse

## 2019-10-23 DIAGNOSIS — Z9889 Other specified postprocedural states: Secondary | ICD-10-CM | POA: Insufficient documentation

## 2019-10-23 DIAGNOSIS — M25511 Pain in right shoulder: Secondary | ICD-10-CM

## 2019-10-23 DIAGNOSIS — M542 Cervicalgia: Secondary | ICD-10-CM | POA: Insufficient documentation

## 2019-10-23 DIAGNOSIS — M797 Fibromyalgia: Secondary | ICD-10-CM

## 2019-10-23 DIAGNOSIS — G894 Chronic pain syndrome: Secondary | ICD-10-CM

## 2019-10-23 DIAGNOSIS — M47816 Spondylosis without myelopathy or radiculopathy, lumbar region: Secondary | ICD-10-CM | POA: Diagnosis not present

## 2019-10-23 DIAGNOSIS — M7062 Trochanteric bursitis, left hip: Secondary | ICD-10-CM

## 2019-10-23 DIAGNOSIS — Z90710 Acquired absence of both cervix and uterus: Secondary | ICD-10-CM | POA: Insufficient documentation

## 2019-10-23 DIAGNOSIS — M48061 Spinal stenosis, lumbar region without neurogenic claudication: Secondary | ICD-10-CM | POA: Diagnosis not present

## 2019-10-23 DIAGNOSIS — Z79891 Long term (current) use of opiate analgesic: Secondary | ICD-10-CM

## 2019-10-23 DIAGNOSIS — M5416 Radiculopathy, lumbar region: Secondary | ICD-10-CM

## 2019-10-23 DIAGNOSIS — Z9049 Acquired absence of other specified parts of digestive tract: Secondary | ICD-10-CM | POA: Insufficient documentation

## 2019-10-23 DIAGNOSIS — Z79899 Other long term (current) drug therapy: Secondary | ICD-10-CM | POA: Insufficient documentation

## 2019-10-23 DIAGNOSIS — Z5181 Encounter for therapeutic drug level monitoring: Secondary | ICD-10-CM

## 2019-10-23 DIAGNOSIS — G8929 Other chronic pain: Secondary | ICD-10-CM

## 2019-10-23 MED ORDER — HYDROCODONE-ACETAMINOPHEN 7.5-325 MG PO TABS: 1.0000 | ORAL_TABLET | Freq: Three times a day (TID) | ORAL | 0 refills | Status: DC | PRN

## 2019-10-23 MED ORDER — GABAPENTIN 300 MG PO CAPS: 300.0000 mg | ORAL_CAPSULE | Freq: Two times a day (BID) | ORAL | 2 refills | Status: DC

## 2019-10-23 NOTE — Progress Notes (Addendum)
Subjective:    Patient ID: Leah Barton, female    DOB: [birth year], 69 y.o.   MRN: [id number]  HPI: Leah Barton is a 69 y.o. female who asked for virtual visit due to chest congestion, headache and sore throat. [name] reports she has a PCP appointment on Friday 10/26/2019.  [name] appointment was changed to virtual visit and she agrees with virtual visit and verbalizes understanding. She states her pain is located in her right shoulder occasionally, lower back pain and left hip pain. She rates her pain 4. Her current exercise regime is walking and performing stretching exercises.  Ms. Rumbaugh Morphine equivalent is 22.50 MME.  She is also prescribed Clonazepam by Dr. [name]. We have discussed the black box warning of using opioids and benzodiazepines. I highlighted the dangers of using these drugs together and discussed the adverse events including respiratory suppression, overdose, cognitive impairment and importance of compliance with current regimen. We will continue to monitor and adjust as indicated.   Last Oral Swab was Performed on 06/29/2019, it was consistent.   Pain Inventory Average Pain 5 Pain Right Now 4 My pain is constant, sharp, burning, dull, tingling and aching  In the last 24 hours, has pain interfered with the following? General activity 4 Relation with others 4 Enjoyment of life 4 What TIME of day is your pain at its worst? all Sleep (in general) Fair  Pain is worse with: walking, bending, sitting, inactivity, unsure and some activites Pain improves with: heat/ice, medication and injections Relief from Meds: 3  Mobility use a cane how many minutes can you walk? 5-10 ability to climb steps?  no do you drive?  yes  Function disabled: date disabled . I need assistance with the following:  household duties  Neuro/Psych weakness numbness tremor tingling trouble walking spasms dizziness confusion depression anxiety suicidal thoughts  Prior  Studies Any changes since last visit?  no  Physicians involved in your care Any changes since last visit?  no   No family history on file. Social History   Socioeconomic History  . Marital status: Married    Spouse name: Not on file  . Number of children: Not on file  . Years of education: Not on file  . Highest education level: Not on file  Occupational History  . Not on file  Tobacco Use  . Smoking status: Never Smoker  . Smokeless tobacco: Never Used  Substance and Sexual Activity  . Alcohol use: No  . Drug use: No  . Sexual activity: Not on file  Other Topics Concern  . Not on file  Social History Narrative  . Not on file   Social Determinants of Health   Financial Resource Strain:   . Difficulty of Paying Living Expenses:   Food Insecurity:   . Worried About Programme researcher, broadcasting/film/video in the Last Year:   . Barista in the Last Year:   Transportation Needs:   . Freight forwarder (Medical):   Marland Kitchen Lack of Transportation (Non-Medical):   Physical Activity:   . Days of Exercise per Week:   . Minutes of Exercise per Session:   Stress:   . Feeling of Stress :   Social Connections:   . Frequency of Communication with Friends and Family:   . Frequency of Social Gatherings with Friends and Family:   . Attends Religious Services:   . Active Member of Clubs or Organizations:   . Attends Banker Meetings:   .  Marital Status:    Past Surgical History:  Procedure Laterality Date  . ANKLE SURGERY    . ASPIRATION OF ABSCESS    . CHOLECYSTECTOMY    . DE QUERVAIN'S RELEASE    . grastricbypas    . HERNIA REPAIR    . NASAL SEPTUM SURGERY    . TOTAL VAGINAL HYSTERECTOMY    . TUBAL LIGATION     No past medical history on file. There were no vitals taken for this visit.  Opioid Risk Score:   Fall Risk Score:  `1  Depression screen PHQ 2/9  Depression screen [facility] 2/9 10/23/2019 06/29/2019 08/15/2018 07/05/2018 04/19/2018 02/21/2018 12/26/2017  Decreased  Interest 1 3 1 1 1 1 1   Down, Depressed, Hopeless 1 3 1 1 1 1 1   PHQ - 2 Score 2 6 2 2 2 2 2   Altered sleeping - - - - - - -  Tired, decreased energy - - - - - - -  Change in appetite - - - - - - -  Feeling bad or failure about yourself  - - - - - - -  Trouble concentrating - - - - - - -  Moving slowly or fidgety/restless - - - - - - -  Suicidal thoughts - 1 - - - - -  PHQ-9 Score - - - - - - -  Difficult doing work/chores - - - - - - -     Review of Systems  Musculoskeletal: Positive for gait problem.       Spasms  Neurological: Positive for dizziness, tremors, weakness and numbness.       Tingling  All other systems reviewed and are negative.      Objective:   Physical Exam Vitals and nursing note reviewed.  Musculoskeletal:     Comments: No Physical Exam Performed: Virtual Visit           Assessment & Plan:  1. Acute exacerbation of Chronic Low Back Pain: Schedule Appointment  with Dr for evaluation of an injection.10/23/2019 2.Chronic low back pain with substantial spondylosis and scoliosis by MRI per Dr. note.08/102021. Refilled: Hydrocodone 7.5/325 mg one tablet every 8 hours as needed #90.Second script sent for the following month. 3. Lumbar Facet Arthropathy/ SI Joint Dysfunction: Continue with Facet Stretches: S/PMBB on 07/12/2017.10/23/2019.. 4. Fibromyalgia:ContinueGabapentin and HEP as Tolerated.10/23/2019. 5.LeftGreater Trochanter Bursitis:Continue to Alternate with Ice and Heat Therapy.10/23/2019  F/U in 2 Months  Virtual Visit: My-Chart Video- Visit Establish Patient Location of Patient: In her Home Location of Provider: In the Office Total Time Spent: 20 Minutes

## 2019-10-24 ENCOUNTER — Encounter: Payer: Self-pay | Admitting: Registered Nurse

## 2019-12-20 ENCOUNTER — Encounter: Payer: Medicare Other | Attending: Physical Medicine & Rehabilitation | Admitting: Registered Nurse

## 2019-12-20 ENCOUNTER — Other Ambulatory Visit: Payer: Self-pay

## 2019-12-20 ENCOUNTER — Encounter: Payer: Self-pay | Admitting: Registered Nurse

## 2019-12-20 VITALS — Ht 60.0 in | Wt 200.0 lb

## 2019-12-20 DIAGNOSIS — M47816 Spondylosis without myelopathy or radiculopathy, lumbar region: Secondary | ICD-10-CM | POA: Diagnosis not present

## 2019-12-20 DIAGNOSIS — M5416 Radiculopathy, lumbar region: Secondary | ICD-10-CM | POA: Insufficient documentation

## 2019-12-20 DIAGNOSIS — M48061 Spinal stenosis, lumbar region without neurogenic claudication: Secondary | ICD-10-CM | POA: Insufficient documentation

## 2019-12-20 DIAGNOSIS — G894 Chronic pain syndrome: Secondary | ICD-10-CM | POA: Insufficient documentation

## 2019-12-20 DIAGNOSIS — M797 Fibromyalgia: Secondary | ICD-10-CM | POA: Diagnosis not present

## 2019-12-20 DIAGNOSIS — Z5181 Encounter for therapeutic drug level monitoring: Secondary | ICD-10-CM | POA: Insufficient documentation

## 2019-12-20 DIAGNOSIS — G8929 Other chronic pain: Secondary | ICD-10-CM | POA: Insufficient documentation

## 2019-12-20 DIAGNOSIS — M546 Pain in thoracic spine: Secondary | ICD-10-CM | POA: Insufficient documentation

## 2019-12-20 DIAGNOSIS — Z79891 Long term (current) use of opiate analgesic: Secondary | ICD-10-CM | POA: Insufficient documentation

## 2019-12-20 MED ORDER — HYDROCODONE-ACETAMINOPHEN 7.5-325 MG PO TABS: 1.0000 | ORAL_TABLET | Freq: Three times a day (TID) | ORAL | 0 refills | Status: DC | PRN

## 2019-12-20 NOTE — Progress Notes (Signed)
Subjective:    Patient ID: Leah Barton, female    DOB: 1950/05/04, 69 y.o.   MRN: [id number]  HPI: Leah Barton is a 69 y.o. female whose appointment was changed to a telephone office visit to reduce the risk of exposure to the COVID-19 virus and to help [name]  remain healthy and safe. The telephone  visit will also provide continuity of care. [name] agrees with telephone visit and verbalizes understanding. She states her pain is located in  Her mid- lower back radiating into her left hip. She rates her pain 6. Her current exercise regime is walking and performing some stretching exercises, she was encouraged to increase he HEP as tolerated, she verbalizes understanding. .  [name] Morphine equivalent is 22.50 MME.  She is also prescribed Clonazepam  by Dr. [name] We have discussed the black box warning of using opioids and benzodiazepines. I highlighted the dangers of using these drugs together and discussed the adverse events including respiratory suppression, overdose, cognitive impairment and importance of compliance with current regimen. We will continue to monitor and adjust as indicated.   Last Oral Swab was Performed on 06/29/2019, it was consistent.    Pain Inventory Average Pain 4 Pain Right Now 6 My pain is constant, sharp, burning and throbbing  In the last 24 hours, has pain interfered with the following? General activity 10 Relation with others 10 Enjoyment of life 10 What TIME of day is your pain at its worst? varies Sleep (in general) Poor  Pain is worse with: walking, bending, sitting, inactivity, standing and some activites Pain improves with: rest and medication Relief from Meds: varies  History reviewed. No pertinent family history. Social History   Socioeconomic History  . Marital status: Married    Spouse name: Not on file  . Number of children: Not on file  . Years of education: Not on file  . Highest education level: Not on file  Occupational  History  . Not on file  Tobacco Use  . Smoking status: Never Smoker  . Smokeless tobacco: Never Used  Substance and Sexual Activity  . Alcohol use: No  . Drug use: No  . Sexual activity: Not on file  Other Topics Concern  . Not on file  Social History Narrative  . Not on file   Social Determinants of Health   Financial Resource Strain:   . Difficulty of Paying Living Expenses: Not on file  Food Insecurity:   . Worried About Programme researcher, broadcasting/film/video in the Last Year: Not on file  . Ran Out of Food in the Last Year: Not on file  Transportation Needs:   . Lack of Transportation (Medical): Not on file  . Lack of Transportation (Non-Medical): Not on file  Physical Activity:   . Days of Exercise per Week: Not on file  . Minutes of Exercise per Session: Not on file  Stress:   . Feeling of Stress : Not on file  Social Connections:   . Frequency of Communication with Friends and Family: Not on file  . Frequency of Social Gatherings with Friends and Family: Not on file  . Attends Religious Services: Not on file  . Active Member of Clubs or Organizations: Not on file  . Attends Banker Meetings: Not on file  . Marital Status: Not on file   Past Surgical History:  Procedure Laterality Date  . ANKLE SURGERY    . ASPIRATION OF ABSCESS    . CHOLECYSTECTOMY    .  DE QUERVAIN'S RELEASE    . grastricbypas    . HERNIA REPAIR    . NASAL SEPTUM SURGERY    . TOTAL VAGINAL HYSTERECTOMY    . TUBAL LIGATION     Past Surgical History:  Procedure Laterality Date  . ANKLE SURGERY    . ASPIRATION OF ABSCESS    . CHOLECYSTECTOMY    . DE QUERVAIN'S RELEASE    . grastricbypas    . HERNIA REPAIR    . NASAL SEPTUM SURGERY    . TOTAL VAGINAL HYSTERECTOMY    . TUBAL LIGATION     History reviewed. No pertinent past medical history. Ht 5' (1.524 m)   Wt 200 lb (90.7 kg)   BMI 39.06 kg/m   Opioid Risk Score:   Fall Risk Score:  `1  Depression screen PHQ 2/9  Depression screen  [facility] 2/9 10/23/2019 06/29/2019 08/15/2018 07/05/2018 04/19/2018 02/21/2018 12/26/2017  Decreased Interest 1 3 1 1 1 1 1   Down, Depressed, Hopeless 1 3 1 1 1 1 1   PHQ - 2 Score 2 6 2 2 2 2 2   Altered sleeping - - - - - - -  Tired, decreased energy - - - - - - -  Change in appetite - - - - - - -  Feeling bad or failure about yourself  - - - - - - -  Trouble concentrating - - - - - - -  Moving slowly or fidgety/restless - - - - - - -  Suicidal thoughts - 1 - - - - -  PHQ-9 Score - - - - - - -  Difficult doing work/chores - - - - - - -    Review of Systems  Constitutional: Negative.   HENT: Negative.   Eyes: Negative.   Respiratory: Negative.   Cardiovascular: Negative.   Gastrointestinal: Negative.   Endocrine: Negative.   Genitourinary: Negative.   Musculoskeletal: Positive for arthralgias, back pain, gait problem and myalgias.  Skin: Negative.   Allergic/Immunologic: Negative.   Neurological: Positive for weakness and numbness.       Tingling  Psychiatric/Behavioral: Positive for dysphoric mood. The patient is nervous/anxious.        Objective:   Physical Exam Vitals and nursing note reviewed.  Musculoskeletal:     Comments: No Physical Exam Performed: Telephone Visit           Assessment & Plan:  1. Chronic low back pain with substantial spondylosis and scoliosis by MRI per Dr. note.12/20/2019. Refilled: Hydrocodone 7.5/325 mg one tablet every 8 hours as needed #90. We will continue the opioid monitoring program, this consists of regular clinic visits, examinations, urine drug screen, pill counts as well as use of [place] Controlled Substance Reporting system. A 12 month History has been reviewed on the 02/19/2020 Controlled Substance Reporting System on 12/20/2019. 2. Lumbar Facet Arthropathy/ SI Joint Dysfunction: Continue with Facet Stretches: S/PMBB on 07/12/2017.12/19/2019.. 3. Fibromyalgia:ContinueGabapentin and HEP as  Tolerated.12/19/2019. 4.Left Greater Trochanter Bursitis:Continue to Alternate with Ice and Heat Therapy.Continue to monitor. 12/19/2019  F/U in 2 Months  Telephone Visit Established Patient Location of Patient: In Her Home Location of Provider: In The Office Total Time Spent: 10 Minutes

## 2020-02-08 ENCOUNTER — Other Ambulatory Visit: Payer: Self-pay | Admitting: Registered Nurse

## 2020-02-11 ENCOUNTER — Other Ambulatory Visit: Payer: Self-pay

## 2020-02-11 MED ORDER — GABAPENTIN 300 MG PO CAPS: 300.0000 mg | ORAL_CAPSULE | Freq: Two times a day (BID) | ORAL | 2 refills | Status: DC

## 2020-02-13 ENCOUNTER — Telehealth: Payer: Self-pay | Admitting: Registered Nurse

## 2020-02-13 MED ORDER — HYDROCODONE-ACETAMINOPHEN 7.5-325 MG PO TABS: 1.0000 | ORAL_TABLET | Freq: Three times a day (TID) | ORAL | 0 refills | Status: DC | PRN

## 2020-02-13 NOTE — Telephone Encounter (Signed)
PMP was Reviewed. Hydrocodone e-scribed: Ms. Tedder appointment was changed due to staffing shortage. MrGuill  is aware via My-Chart message.

## 2020-02-14 ENCOUNTER — Encounter: Payer: Medicare Other | Admitting: Registered Nurse

## 2020-02-20 ENCOUNTER — Ambulatory Visit: Payer: Medicare Other | Admitting: Registered Nurse

## 2020-02-26 ENCOUNTER — Encounter: Payer: Medicare Other | Attending: Physical Medicine & Rehabilitation | Admitting: Registered Nurse

## 2020-02-26 ENCOUNTER — Other Ambulatory Visit: Payer: Self-pay

## 2020-02-26 ENCOUNTER — Encounter: Payer: Self-pay | Admitting: Registered Nurse

## 2020-02-26 VITALS — BP 112/74 | HR 78 | Temp 98.0°F | Ht 60.0 in | Wt 201.8 lb

## 2020-02-26 DIAGNOSIS — M47816 Spondylosis without myelopathy or radiculopathy, lumbar region: Secondary | ICD-10-CM

## 2020-02-26 DIAGNOSIS — M545 Low back pain, unspecified: Secondary | ICD-10-CM

## 2020-02-26 DIAGNOSIS — M797 Fibromyalgia: Secondary | ICD-10-CM | POA: Diagnosis present

## 2020-02-26 DIAGNOSIS — G894 Chronic pain syndrome: Secondary | ICD-10-CM | POA: Diagnosis present

## 2020-02-26 DIAGNOSIS — M5416 Radiculopathy, lumbar region: Secondary | ICD-10-CM

## 2020-02-26 DIAGNOSIS — G8929 Other chronic pain: Secondary | ICD-10-CM | POA: Diagnosis present

## 2020-02-26 DIAGNOSIS — M7061 Trochanteric bursitis, right hip: Secondary | ICD-10-CM

## 2020-02-26 DIAGNOSIS — M48061 Spinal stenosis, lumbar region without neurogenic claudication: Secondary | ICD-10-CM

## 2020-02-26 DIAGNOSIS — Z5181 Encounter for therapeutic drug level monitoring: Secondary | ICD-10-CM

## 2020-02-26 DIAGNOSIS — Z79891 Long term (current) use of opiate analgesic: Secondary | ICD-10-CM

## 2020-02-26 MED ORDER — HYDROCODONE-ACETAMINOPHEN 7.5-325 MG PO TABS: 1.0000 | ORAL_TABLET | Freq: Three times a day (TID) | ORAL | 0 refills | Status: DC | PRN

## 2020-02-26 NOTE — Progress Notes (Signed)
Subjective:    Patient ID: Leah Barton, female    DOB: Aug 11, 1950, 69 y.o.   MRN: [id number]  HPI: Leah Barton is a 69 y.o. female who returns for follow up appointment for chronic pain and medication refill. She states her pain is located in her lower back radiating into her right hip and right lower extremity with numbness, also reports the pain has increased in intensity. She states she is compliant with her medication gabapentin and Oxycodone. We will order a X-ray, she verbalizes understanding. . She rates her pain 6. Her current exercise regime is walking and performing stretching exercises.  Leah Barton is 22.50 MME. She  is also prescribed Clonazepam by Dr. [name]. We have discussed the black box warning of using opioids and benzodiazepines. I highlighted the dangers of using these drugs together and discussed the adverse events including respiratory suppression, overdose, cognitive impairment and importance of compliance with current regimen. We will continue to monitor and adjust as indicated.     Oral Swab was Performed Today. .  Pain Inventory Average Pain 5 Pain Right Now 6 My pain is sharp, dull and stabbing  In the last 24 hours, has pain interfered with the following? General activity 3 Relation with others 2 Enjoyment of life 2 What TIME of day is your pain at its worst? morning , daytime, evening and night Sleep (in general) Fair  Pain is worse with: some activites Pain improves with: rest and medication Relief from Meds: 4  No family history on file. Social History   Socioeconomic History  . Marital status: Married    Spouse name: Not on file  . Number of children: Not on file  . Years of education: Not on file  . Highest education level: Not on file  Occupational History  . Not on file  Tobacco Use  . Smoking status: Never Smoker  . Smokeless tobacco: Never Used  Substance and Sexual Activity  . Alcohol use: No  . Drug use: No  .  Sexual activity: Not on file  Other Topics Concern  . Not on file  Social History Narrative  . Not on file   Social Determinants of Health   Financial Resource Strain: Not on file  Food Insecurity: Not on file  Transportation Needs: Not on file  Physical Activity: Not on file  Stress: Not on file  Social Connections: Not on file   Past Surgical History:  Procedure Laterality Date  . ANKLE SURGERY    . ASPIRATION OF ABSCESS    . CHOLECYSTECTOMY    . DE QUERVAIN'S RELEASE    . grastricbypas    . HERNIA REPAIR    . NASAL SEPTUM SURGERY    . TOTAL VAGINAL HYSTERECTOMY    . TUBAL LIGATION     Past Surgical History:  Procedure Laterality Date  . ANKLE SURGERY    . ASPIRATION OF ABSCESS    . CHOLECYSTECTOMY    . DE QUERVAIN'S RELEASE    . grastricbypas    . HERNIA REPAIR    . NASAL SEPTUM SURGERY    . TOTAL VAGINAL HYSTERECTOMY    . TUBAL LIGATION     No past medical history on file. BP 112/74   Pulse 78   Temp 98 F (36.7 C)   Ht 5' (1.524 m)   Wt 201 lb 12.8 oz (91.5 kg)   SpO2 90%   BMI 39.41 kg/m   Opioid Risk Score:   Fall Risk Score:  `  1  Depression screen PHQ 2/9  Depression screen [facility] 2/9 10/23/2019 06/29/2019 08/15/2018 07/05/2018 04/19/2018 02/21/2018 12/26/2017  Decreased Interest 1 3 1 1 1 1 1   Down, Depressed, Hopeless 1 3 1 1 1 1 1   PHQ - 2 Score 2 6 2 2 2 2 2   Altered sleeping - - - - - - -  Tired, decreased energy - - - - - - -  Change in appetite - - - - - - -  Feeling bad or failure about yourself  - - - - - - -  Trouble concentrating - - - - - - -  Moving slowly or fidgety/restless - - - - - - -  Suicidal thoughts - 1 - - - - -  PHQ-9 Score - - - - - - -  Difficult doing work/chores - - - - - - -    Review of Systems  Musculoskeletal: Positive for back pain.       Hip pain Leg pain  shoulder pain  All other systems reviewed and are negative.      Objective:   Physical Exam Vitals and nursing note reviewed.  Constitutional:       Appearance: Normal appearance. She is obese.  Cardiovascular:     Rate and Rhythm: Normal rate and regular rhythm.     Pulses: Normal pulses.     Heart sounds: Normal heart sounds.  Pulmonary:     Effort: Pulmonary effort is normal.     Breath sounds: Normal breath sounds.  Musculoskeletal:     Cervical back: Normal range of motion and neck supple.     Comments: Normal Muscle Bulk and Muscle Testing Reveals:  Upper Extremities: Full ROM and Muscle Strength 5/5 Lumbar Paraspinal Tenderness: L-3-L-5 Right Greater Trochanter Tenderness   Lower Extremities: Full ROM and Muscle Strength 5/5 Arises from Table slowly Antalgic  Gait   Skin:    General: Skin is warm and dry.  Neurological:     Mental Status: She is alert and oriented to person, place, and time.  Psychiatric:        Mood and Affect: Mood normal.        Behavior: Behavior normal.           Assessment & Plan:  1. Acute exacerbation of chronic low back pain: RX: Lumbar X-ray.  2. Chronic low back pain with substantial spondylosis and scoliosis by MRI per Dr. note.02/26/2020. Refilled: Hydrocodone 7.5/325 mg one tablet every 8 hours as needed #90.Second script sent for the following month.  We will continue the opioid monitoring program, this consists of regular clinic visits, examinations, urine drug screen, pill counts as well as use of [place] Controlled Substance Reporting system. A 12 month History has been reviewed on the 02/28/2020 Controlled Substance Reporting System on 02/26/2020. 3. Lumbar Facet Arthropathy/ SI Joint Dysfunction: Continue with Facet Stretches: S/PMBB on 07/12/2017.02/26/2020.. 4. Fibromyalgia:ContinueGabapentin and HEP as Tolerated.02/26/2020. 5.LeftGreater Trochanter Bursitis:Continue to Alternate with Ice and Heat Therapy.Continue to monitor. 02/26/2020  F/U in 2 Months

## 2020-03-03 LAB — DRUG TOX MONITOR 1 W/CONF, ORAL FLD
Alprazolam: NEGATIVE ng/mL (ref <0.50)
Amobarbital: NEGATIVE ng/mL (ref <10)
Amphetamines: NEGATIVE ng/mL (ref <10)
Barbiturates: POSITIVE ng/mL — AB (ref <10)
Benzodiazepines: POSITIVE ng/mL — AB (ref <0.50)
Buprenorphine: NEGATIVE ng/mL (ref <0.10)
Butalbital: NEGATIVE ng/mL (ref <10)
Chlordiazepoxide: NEGATIVE ng/mL (ref <0.50)
Clonazepam: 0.61 ng/mL — ABNORMAL HIGH (ref <0.50)
Cocaine: NEGATIVE ng/mL (ref <5.0)
Codeine: NEGATIVE ng/mL (ref <2.5)
Diazepam: NEGATIVE ng/mL (ref <0.50)
Dihydrocodeine: 9.2 ng/mL — ABNORMAL HIGH (ref <2.5)
Fentanyl: NEGATIVE ng/mL (ref <0.10)
Flunitrazepam: NEGATIVE ng/mL (ref <0.50)
Flurazepam: NEGATIVE ng/mL (ref <0.50)
Heroin Metabolite: NEGATIVE ng/mL (ref <1.0)
Hydrocodone: 59.7 ng/mL — ABNORMAL HIGH (ref <2.5)
Hydromorphone: NEGATIVE ng/mL (ref <2.5)
Lorazepam: NEGATIVE ng/mL (ref <0.50)
MARIJUANA: NEGATIVE ng/mL (ref <2.5)
MDMA: NEGATIVE ng/mL (ref <10)
Meprobamate: NEGATIVE ng/mL (ref <2.5)
Methadone: NEGATIVE ng/mL (ref <5.0)
Midazolam: NEGATIVE ng/mL (ref <0.50)
Morphine: NEGATIVE ng/mL (ref <2.5)
Nicotine Metabolite: NEGATIVE ng/mL (ref <5.0)
Nordiazepam: NEGATIVE ng/mL (ref <0.50)
Norhydrocodone: 24.5 ng/mL — ABNORMAL HIGH (ref <2.5)
Noroxycodone: NEGATIVE ng/mL (ref <2.5)
Opiates: POSITIVE ng/mL — AB (ref <2.5)
Oxazepam: NEGATIVE ng/mL (ref <0.50)
Oxycodone: NEGATIVE ng/mL (ref <2.5)
Oxymorphone: NEGATIVE ng/mL (ref <2.5)
Pentobarbital: NEGATIVE ng/mL (ref <10)
Phencyclidine: NEGATIVE ng/mL (ref <10)
Phenobarbital: 262 ng/mL — ABNORMAL HIGH (ref <10)
Secobarbital: NEGATIVE ng/mL (ref <10)
Tapentadol: NEGATIVE ng/mL (ref <5.0)
Temazepam: NEGATIVE ng/mL (ref <0.50)
Tramadol: NEGATIVE ng/mL (ref <5.0)
Triazolam: NEGATIVE ng/mL (ref <0.50)
Zolpidem: NEGATIVE ng/mL (ref <5.0)

## 2020-03-03 LAB — DRUG TOX ALC METAB W/CON, ORAL FLD: Alcohol Metabolite: NEGATIVE ng/mL (ref <25)

## 2020-03-04 ENCOUNTER — Telehealth: Payer: Self-pay | Admitting: *Deleted

## 2020-03-04 NOTE — Telephone Encounter (Signed)
Oral swab drug screen was consistent for prescribed medications.  ?

## 2020-04-23 ENCOUNTER — Telehealth: Payer: Self-pay | Admitting: Registered Nurse

## 2020-04-23 MED ORDER — METHYLPREDNISOLONE 4 MG PO TBPK: ORAL_TABLET | ORAL | 0 refills | Status: DC

## 2020-04-23 NOTE — Telephone Encounter (Signed)
[name] sent a My-Chart message with complaints of lower back pain and hip pain. We will prescribe medrol dose pk for acute exacerbation of chronic low back pain. She is aware via My-Chart message.

## 2020-04-30 ENCOUNTER — Encounter: Payer: Medicare Other | Admitting: Physical Medicine & Rehabilitation

## 2020-05-06 ENCOUNTER — Ambulatory Visit: Payer: Medicare Other | Admitting: Registered Nurse

## 2020-05-15 ENCOUNTER — Other Ambulatory Visit: Payer: Self-pay

## 2020-05-15 ENCOUNTER — Encounter: Payer: Medicare Other | Attending: Physical Medicine & Rehabilitation | Admitting: Registered Nurse

## 2020-05-15 ENCOUNTER — Encounter: Payer: Self-pay | Admitting: Registered Nurse

## 2020-05-15 VITALS — BP 113/76 | HR 88 | Temp 98.7°F | Ht 60.0 in | Wt 201.6 lb

## 2020-05-15 DIAGNOSIS — M7062 Trochanteric bursitis, left hip: Secondary | ICD-10-CM

## 2020-05-15 DIAGNOSIS — Z5181 Encounter for therapeutic drug level monitoring: Secondary | ICD-10-CM

## 2020-05-15 DIAGNOSIS — G894 Chronic pain syndrome: Secondary | ICD-10-CM

## 2020-05-15 DIAGNOSIS — M797 Fibromyalgia: Secondary | ICD-10-CM | POA: Diagnosis present

## 2020-05-15 DIAGNOSIS — M5416 Radiculopathy, lumbar region: Secondary | ICD-10-CM

## 2020-05-15 DIAGNOSIS — M48061 Spinal stenosis, lumbar region without neurogenic claudication: Secondary | ICD-10-CM | POA: Diagnosis present

## 2020-05-15 DIAGNOSIS — M7061 Trochanteric bursitis, right hip: Secondary | ICD-10-CM

## 2020-05-15 DIAGNOSIS — Z79891 Long term (current) use of opiate analgesic: Secondary | ICD-10-CM

## 2020-05-15 DIAGNOSIS — M47816 Spondylosis without myelopathy or radiculopathy, lumbar region: Secondary | ICD-10-CM

## 2020-05-15 MED ORDER — HYDROCODONE-ACETAMINOPHEN 7.5-325 MG PO TABS: 1.0000 | ORAL_TABLET | Freq: Three times a day (TID) | ORAL | 0 refills | Status: DC | PRN

## 2020-05-15 NOTE — Progress Notes (Signed)
Subjective:    Patient ID: Leah Barton, female    DOB: 06-May-1950, 70 y.o.   MRN: [id number]  HPI: Leah Barton is a 70 y.o. female who returns for follow up appointment for chronic pain and medication refill. She states her pain is located in her lower back, bilateral hips L>R and generalized pain. She rates her pain 6. Her  current exercise regime is walking and performing stretching exercises.  [name] Morphine equivalent is 22.50 MME. She is also prescribed Clonazepam by Dr. [name]. We have discussed the black box warning of using opioids and benzodiazepines. I highlighted the dangers of using these drugs together and discussed the adverse events including respiratory suppression, overdose, cognitive impairment and importance of compliance with current regimen. We will continue to monitor and adjust as indicated.   Last Oral Swab was Performed on 02/26/2020, it was consistent.     Pain Inventory Average Pain 4 Pain Right Now 6 My pain is constant, sharp, burning, dull, stabbing, tingling and aching  In the last 24 hours, has pain interfered with the following? General activity 7 Relation with others 6 Enjoyment of life 7 What TIME of day is your pain at its worst? varies Sleep (in general) Fair  Pain is worse with: walking, bending, sitting, inactivity, standing and some activites Pain improves with: rest, heat/ice, pacing activities and medication Relief from Meds: 6  No family history on file. Social History   Socioeconomic History  . Marital status: Married    Spouse name: Not on file  . Number of children: Not on file  . Years of education: Not on file  . Highest education level: Not on file  Occupational History  . Not on file  Tobacco Use  . Smoking status: Never Smoker  . Smokeless tobacco: Never Used  Substance and Sexual Activity  . Alcohol use: No  . Drug use: No  . Sexual activity: Not on file  Other Topics Concern  . Not on file  Social History  Narrative  . Not on file   Social Determinants of Health   Financial Resource Strain: Not on file  Food Insecurity: Not on file  Transportation Needs: Not on file  Physical Activity: Not on file  Stress: Not on file  Social Connections: Not on file   Past Surgical History:  Procedure Laterality Date  . ANKLE SURGERY    . ASPIRATION OF ABSCESS    . CHOLECYSTECTOMY    . DE QUERVAIN'S RELEASE    . grastricbypas    . HERNIA REPAIR    . NASAL SEPTUM SURGERY    . TOTAL VAGINAL HYSTERECTOMY    . TUBAL LIGATION     Past Surgical History:  Procedure Laterality Date  . ANKLE SURGERY    . ASPIRATION OF ABSCESS    . CHOLECYSTECTOMY    . DE QUERVAIN'S RELEASE    . grastricbypas    . HERNIA REPAIR    . NASAL SEPTUM SURGERY    . TOTAL VAGINAL HYSTERECTOMY    . TUBAL LIGATION     No past medical history on file. There were no vitals taken for this visit.  Opioid Risk Score:   Fall Risk Score:  `1  Depression screen PHQ 2/9  Depression screen [facility] 2/9 10/23/2019 06/29/2019 08/15/2018 07/05/2018 04/19/2018 02/21/2018 12/26/2017  Decreased Interest 1 3 1 1 1 1 1   Down, Depressed, Hopeless 1 3 1 1 1 1 1   PHQ - 2 Score 2 6 2 2 2  2  2  Altered sleeping - - - - - - -  Tired, decreased energy - - - - - - -  Change in appetite - - - - - - -  Feeling bad or failure about yourself  - - - - - - -  Trouble concentrating - - - - - - -  Moving slowly or fidgety/restless - - - - - - -  Suicidal thoughts - 1 - - - - -  PHQ-9 Score - - - - - - -  Difficult doing work/chores - - - - - - -   Review of Systems  Musculoskeletal: Positive for back pain and gait problem.       Pain in buttocks, right shoulder,arms  All other systems reviewed and are negative.      Objective:   Physical Exam Vitals and nursing note reviewed.  Constitutional:      Appearance: Normal appearance. She is obese.  Cardiovascular:     Rate and Rhythm: Normal rate and regular rhythm.     Pulses: Normal pulses.      Heart sounds: Normal heart sounds.  Pulmonary:     Effort: Pulmonary effort is normal.     Breath sounds: Normal breath sounds.  Musculoskeletal:     Cervical back: Normal range of motion and neck supple.     Comments: Normal Muscle Bulk and Muscle Testing Reveals:  Upper Extremities: Full ROM and Muscle Strength 5/5 Lumbar Paraspinal Tenderness: L-3-L-5 Bilateral Greater Trochanter Tenderness: L>R Lower Extremities: Full ROM and Muscle Strength 5/5 Arises from Table slowly Antagic Gait   Skin:    General: Skin is warm and dry.  Neurological:     Mental Status: She is alert and oriented to person, place, and time.  Psychiatric:        Mood and Affect: Mood normal.        Behavior: Behavior normal.           Assessment & Plan:  1. Chronic low back pain with substantial spondylosis and scoliosis by MRI per Dr. [place] note.05/15/2020. Refilled: Hydrocodone 7.5/325 mg one tablet every 8 hours as needed #90.Second script sent for the following month.  We will continue the opioid monitoring program, this consists of regular clinic visits, examinations, urine drug screen, pill counts as well as use of [place] Controlled Substance Reporting system. A 12 month History has been reviewed on the [place] Controlled Substance Reporting Systemon 05/15/2020. 2. Lumbar Facet Arthropathy/ SI Joint Dysfunction: Continue with Facet Stretches: S/PMBB on 07/12/2017.05/15/2020.. 3. Fibromyalgia:ContinueGabapentin and HEP as Tolerated.05/15/2020. 4.Bilateral Greater Trochanter Bursitis: L>R.Continue to Alternate with Ice and Heat Therapy.Continue to monitor. 05/15/2020  F/U in 2 Months

## 2020-05-21 ENCOUNTER — Other Ambulatory Visit: Payer: Self-pay | Admitting: *Deleted

## 2020-05-21 ENCOUNTER — Other Ambulatory Visit: Payer: Self-pay | Admitting: Registered Nurse

## 2020-05-21 MED ORDER — GABAPENTIN 300 MG PO CAPS: 300.0000 mg | ORAL_CAPSULE | Freq: Two times a day (BID) | ORAL | 2 refills | Status: DC

## 2020-07-07 ENCOUNTER — Telehealth: Payer: Self-pay | Admitting: Registered Nurse

## 2020-07-07 MED ORDER — HYDROCODONE-ACETAMINOPHEN 7.5-325 MG PO TABS: 1.0000 | ORAL_TABLET | Freq: Three times a day (TID) | ORAL | 0 refills | Status: DC | PRN

## 2020-07-07 NOTE — Telephone Encounter (Signed)
PMP was Reviewed: Hydrocodone e-scribed. Ms. Pyeatt appointment was changed due to illness. Ms. Cregan is aware of the above via My-Chart Message.

## 2020-07-08 ENCOUNTER — Encounter: Payer: Medicare Other | Admitting: Registered Nurse

## 2020-07-21 ENCOUNTER — Other Ambulatory Visit: Payer: Self-pay

## 2020-07-21 ENCOUNTER — Encounter: Payer: Medicare Other | Attending: Physical Medicine & Rehabilitation | Admitting: Registered Nurse

## 2020-07-21 ENCOUNTER — Encounter: Payer: Self-pay | Admitting: Registered Nurse

## 2020-07-21 VITALS — BP 116/77 | HR 83 | Temp 98.0°F | Ht 60.0 in | Wt 202.0 lb

## 2020-07-21 DIAGNOSIS — M7061 Trochanteric bursitis, right hip: Secondary | ICD-10-CM | POA: Diagnosis present

## 2020-07-21 DIAGNOSIS — Z5181 Encounter for therapeutic drug level monitoring: Secondary | ICD-10-CM | POA: Diagnosis present

## 2020-07-21 DIAGNOSIS — M5416 Radiculopathy, lumbar region: Secondary | ICD-10-CM | POA: Diagnosis present

## 2020-07-21 DIAGNOSIS — M47816 Spondylosis without myelopathy or radiculopathy, lumbar region: Secondary | ICD-10-CM

## 2020-07-21 DIAGNOSIS — Z79891 Long term (current) use of opiate analgesic: Secondary | ICD-10-CM | POA: Diagnosis present

## 2020-07-21 DIAGNOSIS — G8929 Other chronic pain: Secondary | ICD-10-CM | POA: Diagnosis present

## 2020-07-21 DIAGNOSIS — G894 Chronic pain syndrome: Secondary | ICD-10-CM | POA: Diagnosis present

## 2020-07-21 DIAGNOSIS — M48061 Spinal stenosis, lumbar region without neurogenic claudication: Secondary | ICD-10-CM | POA: Diagnosis present

## 2020-07-21 DIAGNOSIS — M7062 Trochanteric bursitis, left hip: Secondary | ICD-10-CM | POA: Diagnosis present

## 2020-07-21 DIAGNOSIS — M546 Pain in thoracic spine: Secondary | ICD-10-CM | POA: Diagnosis present

## 2020-07-21 DIAGNOSIS — M797 Fibromyalgia: Secondary | ICD-10-CM

## 2020-07-21 MED ORDER — HYDROCODONE-ACETAMINOPHEN 7.5-325 MG PO TABS
1.0000 | ORAL_TABLET | Freq: Three times a day (TID) | ORAL | 0 refills | Status: DC | PRN
Start: 2020-07-21 — End: 2020-10-06

## 2020-07-21 MED ORDER — GABAPENTIN 300 MG PO CAPS: 300.0000 mg | ORAL_CAPSULE | Freq: Two times a day (BID) | ORAL | 2 refills | Status: DC

## 2020-07-21 MED ORDER — HYDROCODONE-ACETAMINOPHEN 7.5-325 MG PO TABS
1.0000 | ORAL_TABLET | Freq: Three times a day (TID) | ORAL | 0 refills | Status: DC | PRN
Start: 2020-07-21 — End: 2020-07-21

## 2020-07-21 NOTE — Progress Notes (Signed)
Subjective:    Patient ID: Leah Barton, female    DOB: 15-Dec-1950, 70 y.o.   MRN: [id number]  HPI: Leah Barton is a 70 y.o. female who returns for follow up appointment for chronic pain and medication refill. She states her pain is located in her mid- lower back and bilateral  hip pain R>L. She rates her pain 4. Her current exercise regime is walking and performing stretching exercises.  Ms. Julius Morphine equivalent is 22.50MME.  She  is also prescribed clonazepam  by Dr. [name]. We have discussed the black box warning of using opioids and benzodiazepines. I highlighted the dangers of using these drugs together and discussed the adverse events including respiratory suppression, overdose, cognitive impairment and importance of compliance with current regimen. We will continue to monitor and adjust as indicated.   UDS ordered today.    Pain Inventory Average Pain 4 Pain Right Now 4 My pain is constant, sharp, burning, dull, stabbing, tingling and aching  In the last 24 hours, has pain interfered with the following? General activity 4 Relation with others 4 Enjoyment of life 4 What TIME of day is your pain at its worst? varies Sleep (in general) Fair  Pain is worse with: standing and some activites Pain improves with: rest, pacing activities and medication Relief from Meds: fair  History reviewed. No pertinent family history. Social History   Socioeconomic History  . Marital status: Married    Spouse name: Not on file  . Number of children: Not on file  . Years of education: Not on file  . Highest education level: Not on file  Occupational History  . Not on file  Tobacco Use  . Smoking status: Never Smoker  . Smokeless tobacco: Never Used  Vaping Use  . Vaping Use: Never used  Substance and Sexual Activity  . Alcohol use: No  . Drug use: No  . Sexual activity: Not on file  Other Topics Concern  . Not on file  Social History Narrative  . Not on file   Social  Determinants of Health   Financial Resource Strain: Not on file  Food Insecurity: Not on file  Transportation Needs: Not on file  Physical Activity: Not on file  Stress: Not on file  Social Connections: Not on file   Past Surgical History:  Procedure Laterality Date  . ANKLE SURGERY    . ASPIRATION OF ABSCESS    . CHOLECYSTECTOMY    . DE QUERVAIN'S RELEASE    . grastricbypas    . HERNIA REPAIR    . NASAL SEPTUM SURGERY    . TOTAL VAGINAL HYSTERECTOMY    . TUBAL LIGATION     Past Surgical History:  Procedure Laterality Date  . ANKLE SURGERY    . ASPIRATION OF ABSCESS    . CHOLECYSTECTOMY    . DE QUERVAIN'S RELEASE    . grastricbypas    . HERNIA REPAIR    . NASAL SEPTUM SURGERY    . TOTAL VAGINAL HYSTERECTOMY    . TUBAL LIGATION     History reviewed. No pertinent past medical history. BP 116/77   Pulse 83   Temp 98 F (36.7 C)   Ht 5' (1.524 m)   Wt 202 lb (91.6 kg)   SpO2 97%   BMI 39.45 kg/m   Opioid Risk Score:   Fall Risk Score:  `1  Depression screen [facility] 2/9  Depression screen [facility] 2/9 05/15/2020 10/23/2019 06/29/2019 08/15/2018 07/05/2018 04/19/2018 02/21/2018  Decreased Interest 1  1 3 1 1 1 1   Down, Depressed, Hopeless 1 1 3 1 1 1 1   PHQ - 2 Score 2 2 6 2 2 2 2   Altered sleeping - - - - - - -  Tired, decreased energy - - - - - - -  Change in appetite - - - - - - -  Feeling bad or failure about yourself  - - - - - - -  Trouble concentrating - - - - - - -  Moving slowly or fidgety/restless - - - - - - -  Suicidal thoughts - - 1 - - - -  PHQ-9 Score - - - - - - -  Difficult doing work/chores - - - - - - -   Review of Systems  Musculoskeletal: Positive for back pain and gait problem.       Right hip pain  All other systems reviewed and are negative.      Objective:   Physical Exam Vitals and nursing note reviewed.  Constitutional:      Appearance: Normal appearance.  Cardiovascular:     Rate and Rhythm: Normal rate and regular rhythm.     Pulses:  Normal pulses.     Heart sounds: Normal heart sounds.  Pulmonary:     Effort: Pulmonary effort is normal.     Breath sounds: Normal breath sounds.  Musculoskeletal:     Cervical back: Normal range of motion and neck supple.     Comments: Normal Muscle Bulk and Muscle Testing Reveals:  Upper Extremities: Full ROM and Muscle Strength 5/5 Thoracic Paraspinal Tenderness: T-7-T-9 Lumbar Paraspinal Tenderness: L-3-L-5 Bilateral Greater Trochanter Tenderness R>L Lower Extremities: Full ROM and Muscle Strength 5/5 Arises from Table Slowly using cane for support Antalgic  Gait   Skin:    General: Skin is warm and dry.  Neurological:     Mental Status: She is alert and oriented to person, place, and time.  Psychiatric:        Mood and Affect: Mood normal.        Behavior: Behavior normal.           Assessment & Plan:  1. Chronic low back pain with substantial spondylosis and scoliosis by MRI per Dr. note.07/21/2020. Refilled: Hydrocodone 7.5/325 mg one tablet every 8 hours as needed #90.Second script sent for the following month. We will continue the opioid monitoring program, this consists of regular clinic visits, examinations, urine drug screen, pill counts as well as use of [place] Controlled Substance Reporting system. A 12 month History has been reviewed on the 09/20/2020 Controlled Substance Reporting Systemon 07/21/2020. 2. Lumbar Facet Arthropathy/ SI Joint Dysfunction: Continue with Facet Stretches: S/PMBB on 07/12/2017.07/21/2020.. 3. Fibromyalgia:ContinueGabapentin and HEP as Tolerated.05/092022. 4.Bilateral Greater Trochanter Bursitis: R>L.Continue to Alternate with Ice and Heat Therapy.Continue to monitor. 07/21/2020  F/U in 2 Months

## 2020-07-30 ENCOUNTER — Telehealth: Payer: Self-pay | Admitting: *Deleted

## 2020-07-30 LAB — TOXASSURE SELECT,+ANTIDEPR,UR

## 2020-07-30 NOTE — Telephone Encounter (Signed)
Urine drug screen for this encounter is consistent for prescribed medication 

## 2020-09-29 ENCOUNTER — Encounter: Payer: Medicare Other | Admitting: Registered Nurse

## 2020-10-06 ENCOUNTER — Other Ambulatory Visit: Payer: Self-pay

## 2020-10-06 ENCOUNTER — Encounter: Payer: Medicare Other | Attending: Physical Medicine & Rehabilitation | Admitting: Registered Nurse

## 2020-10-06 VITALS — BP 122/86 | HR 75 | Temp 98.8°F | Ht 60.0 in | Wt 206.4 lb

## 2020-10-06 DIAGNOSIS — M546 Pain in thoracic spine: Secondary | ICD-10-CM | POA: Diagnosis present

## 2020-10-06 DIAGNOSIS — M5416 Radiculopathy, lumbar region: Secondary | ICD-10-CM | POA: Insufficient documentation

## 2020-10-06 DIAGNOSIS — Z5181 Encounter for therapeutic drug level monitoring: Secondary | ICD-10-CM | POA: Diagnosis present

## 2020-10-06 DIAGNOSIS — G894 Chronic pain syndrome: Secondary | ICD-10-CM | POA: Insufficient documentation

## 2020-10-06 DIAGNOSIS — M47816 Spondylosis without myelopathy or radiculopathy, lumbar region: Secondary | ICD-10-CM | POA: Diagnosis present

## 2020-10-06 DIAGNOSIS — M7061 Trochanteric bursitis, right hip: Secondary | ICD-10-CM | POA: Diagnosis present

## 2020-10-06 DIAGNOSIS — M48061 Spinal stenosis, lumbar region without neurogenic claudication: Secondary | ICD-10-CM | POA: Diagnosis present

## 2020-10-06 DIAGNOSIS — M797 Fibromyalgia: Secondary | ICD-10-CM | POA: Diagnosis present

## 2020-10-06 DIAGNOSIS — Z79891 Long term (current) use of opiate analgesic: Secondary | ICD-10-CM | POA: Diagnosis present

## 2020-10-06 DIAGNOSIS — G8929 Other chronic pain: Secondary | ICD-10-CM | POA: Insufficient documentation

## 2020-10-06 MED ORDER — HYDROCODONE-ACETAMINOPHEN 7.5-325 MG PO TABS: 1.0000 | ORAL_TABLET | Freq: Three times a day (TID) | ORAL | 0 refills | Status: DC | PRN

## 2020-10-06 NOTE — Progress Notes (Signed)
Subjective:    Patient ID: Leah Barton, female    DOB: Nov 09, 1950, 70 y.o.   MRN: [id number]  HPI: Leah Barton is a 70 y.o. female who returns for follow up appointment for chronic pain and medication refill. She states her pain is located in her mid- lower back  radiating into her right hip . She rates her pain 3. Her current exercise regime is walking and performing stretching exercises.  Leah Barton Morphine equivalent is 22.50 MME. She is also prescribed Clonazepam  by Dr. Norton Pastel .We have discussed the black box warning of using opioids and benzodiazepines. I highlighted the dangers of using these drugs together and discussed the adverse events including respiratory suppression, overdose, cognitive impairment and importance of compliance with current regimen. We will continue to monitor and adjust as indicated.     Last UDS was Performed on 07/21/2020, it was consistent.     Pain Inventory Average Pain 5 Pain Right Now 3 My pain is sharp, burning, dull, stabbing, tingling, and aching  In the last 24 hours, has pain interfered with the following? General activity 7 Relation with others 7 Enjoyment of life 8 What TIME of day is your pain at its worst? morning , daytime, evening, and night Sleep (in general) Fair  Pain is worse with: walking, bending, sitting, and unsure Pain improves with: rest, heat/ice, therapy/exercise, pacing activities, medication, and injections Relief from Meds: 7  No family history on file. Social History   Socioeconomic History   Marital status: Married    Spouse name: Not on file   Number of children: Not on file   Years of education: Not on file   Highest education level: Not on file  Occupational History   Not on file  Tobacco Use   Smoking status: Never   Smokeless tobacco: Never  Vaping Use   Vaping Use: Never used  Substance and Sexual Activity   Alcohol use: No   Drug use: No   Sexual activity: Not on file  Other Topics Concern    Not on file  Social History Narrative   Not on file   Social Determinants of Health   Financial Resource Strain: Not on file  Food Insecurity: Not on file  Transportation Needs: Not on file  Physical Activity: Not on file  Stress: Not on file  Social Connections: Not on file   Past Surgical History:  Procedure Laterality Date   ANKLE SURGERY     ASPIRATION OF ABSCESS     CHOLECYSTECTOMY     DE QUERVAIN'S RELEASE     grastricbypas     HERNIA REPAIR     NASAL SEPTUM SURGERY     TOTAL VAGINAL HYSTERECTOMY     TUBAL LIGATION     Past Surgical History:  Procedure Laterality Date   ANKLE SURGERY     ASPIRATION OF ABSCESS     CHOLECYSTECTOMY     DE QUERVAIN'S RELEASE     grastricbypas     HERNIA REPAIR     NASAL SEPTUM SURGERY     TOTAL VAGINAL HYSTERECTOMY     TUBAL LIGATION     No past medical history on file. BP (!) 164/71 (BP Location: Left Arm, Patient Position: Sitting, Cuff Size: Normal)   Pulse 76   Temp 98.8 F (37.1 C) (Oral)   SpO2 95%   Opioid Risk Score:   Fall Risk Score:  `1  Depression screen [facility] 2/9  Depression screen [facility] 2/9 07/21/2020 05/15/2020 10/23/2019 06/29/2019  08/15/2018 07/05/2018 04/19/2018  Decreased Interest 1 1 1 3 1 1 1   Down, Depressed, Hopeless 1 1 1 3 1 1 1   PHQ - 2 Score 2 2 2 6 2 2 2   Altered sleeping - - - - - - -  Tired, decreased energy - - - - - - -  Change in appetite - - - - - - -  Feeling bad or failure about yourself  - - - - - - -  Trouble concentrating - - - - - - -  Moving slowly or fidgety/restless - - - - - - -  Suicidal thoughts - - - 1 - - -  PHQ-9 Score - - - - - - -  Difficult doing work/chores - - - - - - -     Review of Systems  Musculoskeletal:  Positive for back pain.       Leg pain  All other systems reviewed and are negative.     Objective:   Physical Exam Vitals and nursing note reviewed.  Constitutional:      Appearance: Normal appearance.  Cardiovascular:     Rate and Rhythm: Normal rate and  regular rhythm.     Pulses: Normal pulses.     Heart sounds: Normal heart sounds.  Pulmonary:     Effort: Pulmonary effort is normal.     Breath sounds: Normal breath sounds.  Musculoskeletal:     Cervical back: Normal range of motion and neck supple.     Comments: Normal Muscle Bulk and Muscle Testing Reveals:  Upper Extremities: Full ROM and Muscle Strength 5/5 Bilateral AC Joint Tenderness Thoracic Paraspinal Tenderness: T-7-T-9  Lumbar Paraspinal Tenderness: L-4-L-5  Lower Extremities: Full ROM and Muscle Strength 5/5 Arises from Table slowly  Antalgic  Gait     Skin:    General: Skin is warm and dry.  Neurological:     Mental Status: She is alert and oriented to person, place, and time.  Psychiatric:        Mood and Affect: Mood normal.        Behavior: Behavior normal.         Assessment & Plan:  1. Chronic low back pain with substantial spondylosis and scoliosis by MRI per Dr. note. 10/06/2020. Refilled: Hydrocodone 7.5/325 mg one tablet every 8 hours as needed #90. Second script sent for the following month.  We will continue the opioid monitoring program, this consists of regular clinic visits, examinations, urine drug screen, pill counts as well as use of [place] Controlled Substance Reporting system. A 12 month History has been reviewed on the 10/08/2020 Controlled Substance Reporting System on 10/06/2020. 2. Lumbar Facet Arthropathy/ SI Joint Dysfunction: Continue with Facet Stretches: S/P  MBB on 07/12/2017. 10/06/2020.. 3. Lumbar Radiculitis: Continue Gabapentin: Continue HEP as Tolerated. Continue to Monitor. 10/06/2020 4. Fibromyalgia: Continue Gabapentin and HEP as Tolerated. 07/252022. 5. Right  Greater Trochanter Bursitis: Continue to Alternate with Ice and Heat Therapy. Continue to monitor. 10/06/2020   F/U in 2 Months

## 2020-10-10 ENCOUNTER — Encounter: Payer: Self-pay | Admitting: Registered Nurse

## 2020-10-18 IMAGING — MR MR LUMBAR SPINE W/O CM
4 of 5 series · 15 of 48 positions shown · non-contrast
Comparison: None.

CLINICAL DATA: Low back pain radiating down both legs for the past
6 months.

EXAM:
MRI LUMBAR SPINE WITHOUT CONTRAST
TECHNIQUE: Multiplanar, multisequence MR imaging of the lumbar spine was
performed. No intravenous contrast was administered.

[Series 3: T2 · sagittal · 4.0mm · 0.74mm/px · 6 of 18 slices shown (1 of 2)]
[im 1/18]
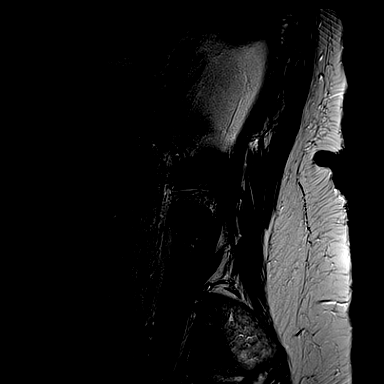
[im 3/18]
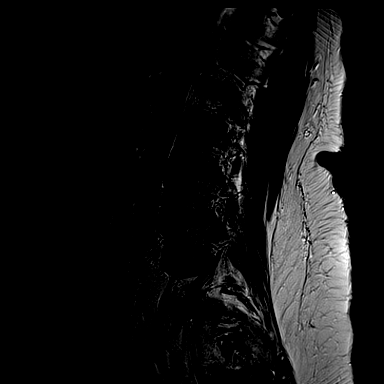
[im 6/18]
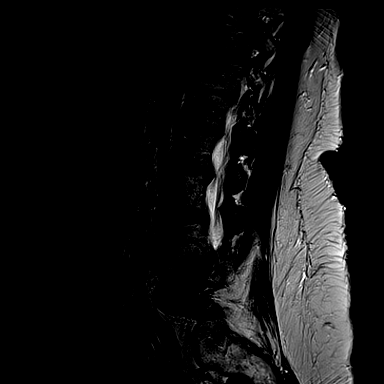
[im 9/18]
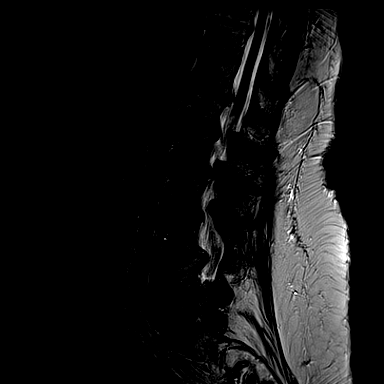
[im 12/18]
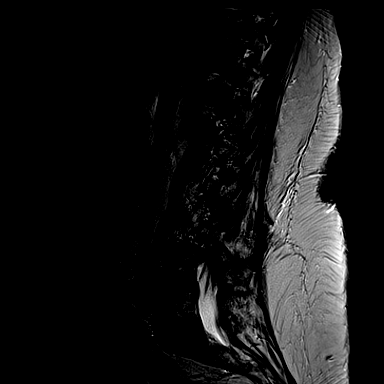
[im 15/18]
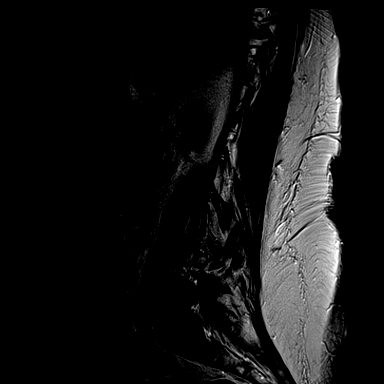

[Series 4: T1 · sagittal · 4.0mm · 0.37mm/px · 3 of 18 slices shown (1 of 2)]
[im 3/18]
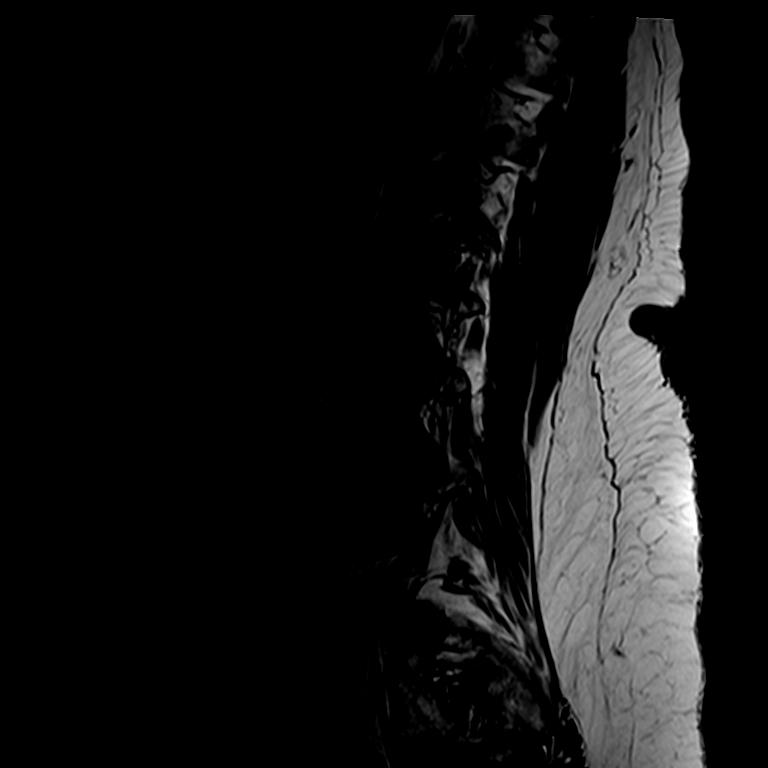
[im 9/18]
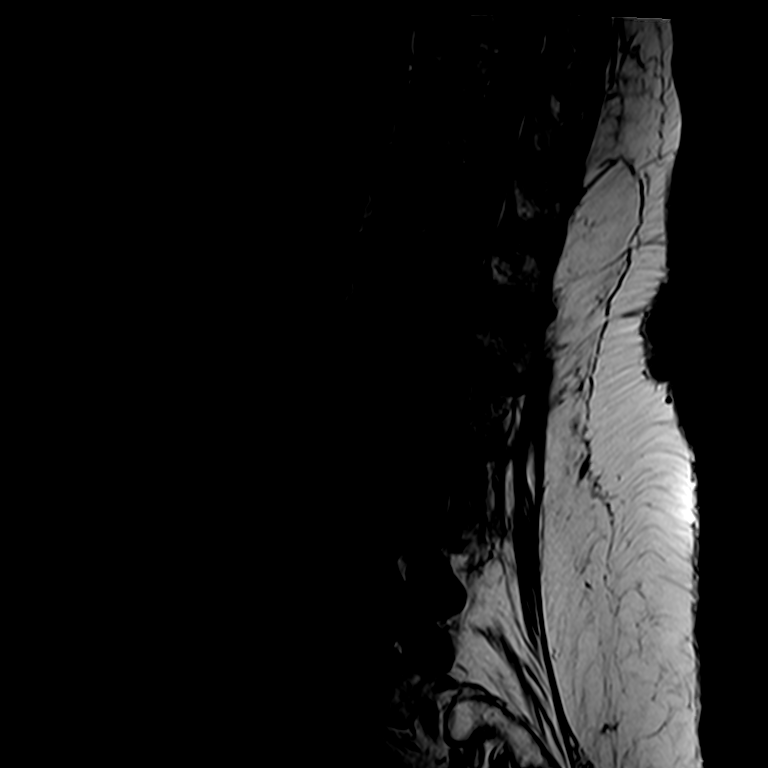
[im 15/18]
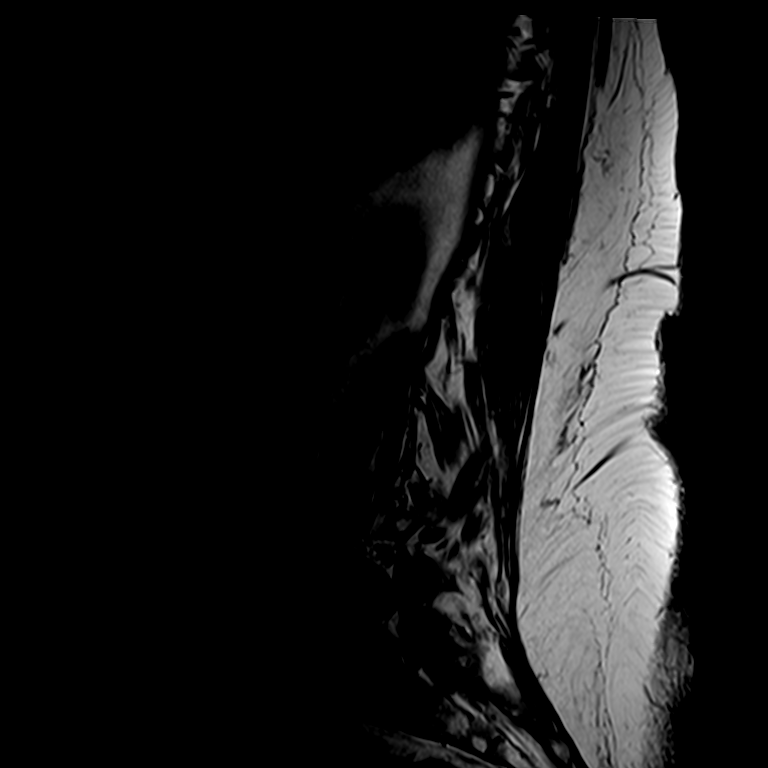

[Series 6: T2 · axial · 4.0mm · 0.28mm/px · z∈[-25,+113]mm · 3 of 40 slices shown (2 of 2)]
[im 7/40]
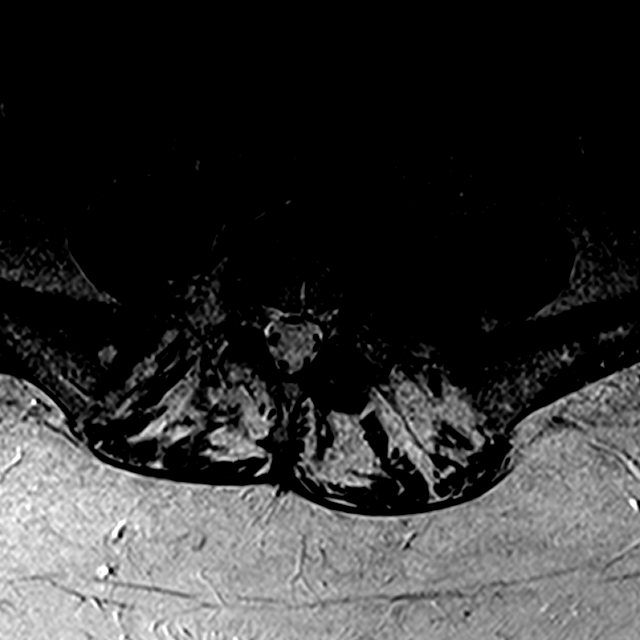
[im 22/40]
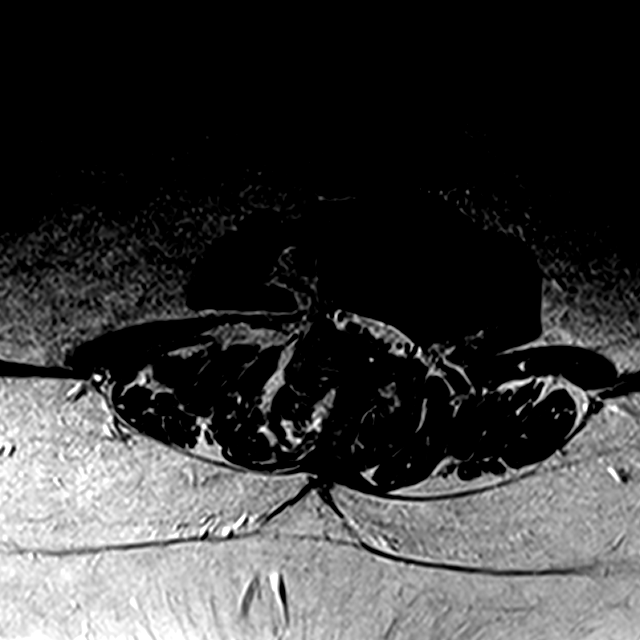
[im 34/40]
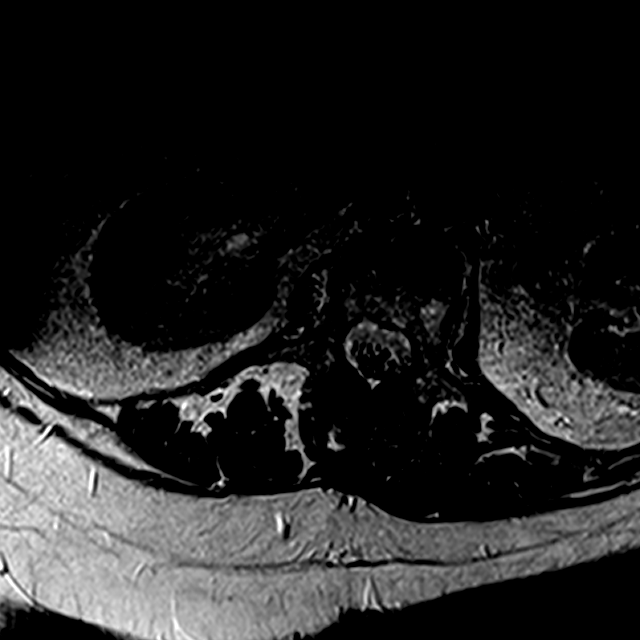

[Series 7: T1 · axial · 4.0mm · 0.28mm/px · z∈[-25,+113]mm · 3 of 40 slices shown (2 of 2)]
[im 7/40]
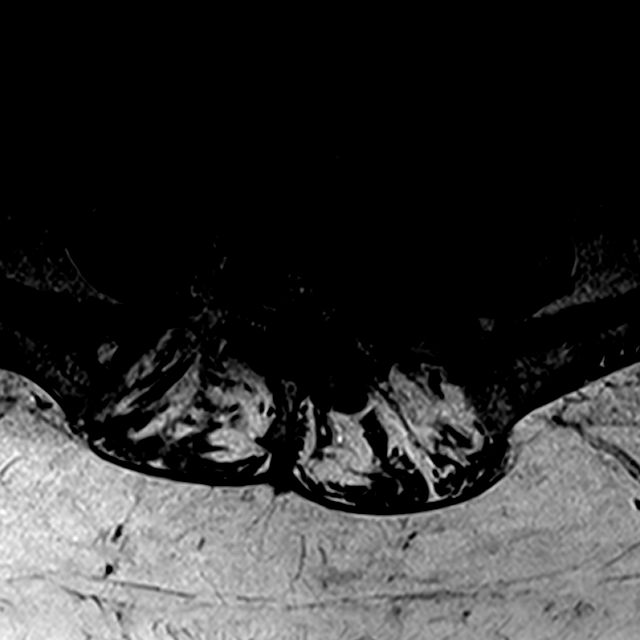
[im 22/40]
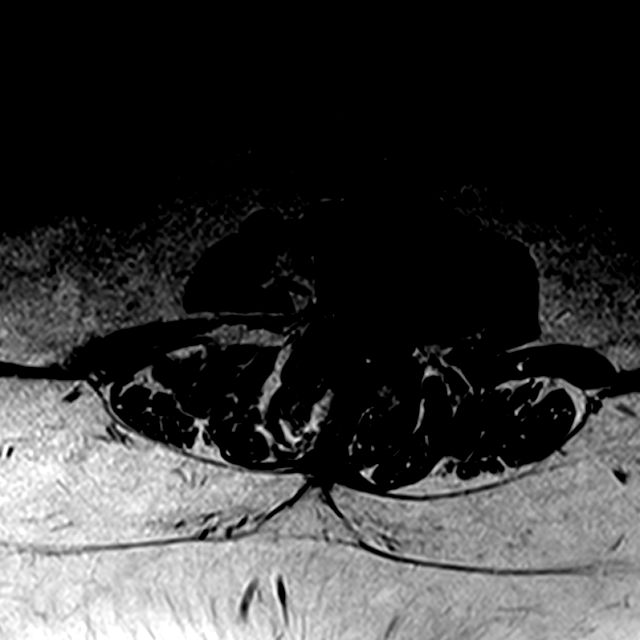
[im 34/40]
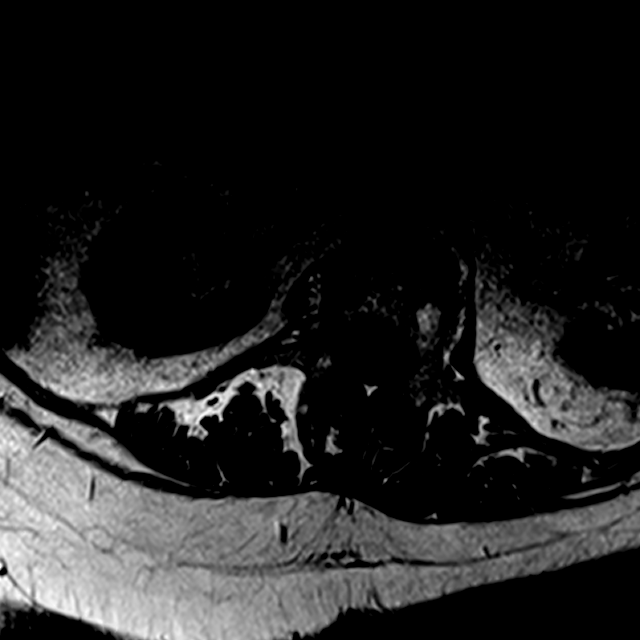

[15 of 48 positions shown; findings below may reference images not displayed]

FINDINGS: Segmentation: Assumed standard. The last well-formed disc space is
designated L5-S1 for the purposes of this report.

Alignment: Prominent rotatory levoscoliosis. Trace anterolisthesis
at L2-L3.

Vertebrae:  No fracture, evidence of discitis, or bone lesion.

Conus medullaris and cauda equina: Conus extends to the L1 level.
Conus and cauda equina appear normal.

Paraspinal and other soft tissues: Negative.

Disc levels:

T9-T10: Only seen on the sagittal images. Minimal disc bulging and
endplate spurring. No stenosis.

T10-T11: Only seen on the sagittal images.  Negative.

T11-T12: Only seen on the sagittal images. Mild disc bulging and
endplate spurring with probable small right paracentral disc
extrusion. No stenosis.

T12-L1: Mild disc bulging and right facet arthropathy. Mild right
neuroforaminal stenosis. No spinal canal or left neuroforaminal
stenosis.

L1-L2: Right eccentric disc bulging and endplate spurring. Mild
right facet arthropathy. Mild right lateral recess stenosis. No
spinal canal or neuroforaminal stenosis.

L2-L3: Minimal left eccentric disc bulging. Mild to moderate
bilateral facet arthropathy. No stenosis.

L3-L4: Small shallow left subarticular and foraminal disc
protrusion. Right-sided disc osteophyte complex. Moderate right and
mild left facet arthropathy. Mild spinal canal and left lateral
recess stenosis. Mild right neuroforaminal stenosis.

L4-L5: Minimal disc bulging and endplate spurring eccentric to the
left. Moderate left facet arthropathy. Mild to moderate left lateral
recess and neuroforaminal stenosis. No spinal canal or right
neuroforaminal stenosis.

L5-S1: Small left subarticular and foraminal disc osteophyte
complex. Moderate left facet arthropathy. Moderate left
neuroforaminal stenosis. No spinal canal or right neuroforaminal
stenosis.
IMPRESSION: 1. Prominent lumbar levoscoliosis with multilevel lumbar spondylosis
as described above. Mild spinal canal stenosis at L3-L4.
2. Moderate left neuroforaminal stenosis at L5-S1. Mild right
neuroforaminal stenosis at T12-L1 and L3-L4.
3. Mild to moderate left lateral recess and neuroforaminal stenosis
at L4-L5.

## 2020-11-07 ENCOUNTER — Telehealth: Payer: Self-pay | Admitting: Registered Nurse

## 2020-11-07 MED ORDER — GABAPENTIN 300 MG PO CAPS: 300.0000 mg | ORAL_CAPSULE | Freq: Two times a day (BID) | ORAL | 2 refills | Status: DC

## 2020-11-07 NOTE — Telephone Encounter (Signed)
Patient needs a refill on Gabapentin.  She said pharmacy had sent something to [place], but they haven't heard anything from [place].  Please call patient when this is complete.

## 2020-11-07 NOTE — Telephone Encounter (Signed)
PMP was reviewed.  Gabapentin was last filled on 09/19/2020 by Dr Alesia Morin.  Gabapentin e- scribed today. Placed a call to Ms. Treloar regarding the above, she verbalizes understanding.

## 2020-12-01 ENCOUNTER — Other Ambulatory Visit: Payer: Self-pay | Admitting: Registered Nurse

## 2020-12-02 ENCOUNTER — Encounter: Payer: Medicare Other | Attending: Physical Medicine & Rehabilitation | Admitting: Registered Nurse

## 2020-12-02 ENCOUNTER — Encounter: Payer: Self-pay | Admitting: Registered Nurse

## 2020-12-02 ENCOUNTER — Other Ambulatory Visit: Payer: Self-pay

## 2020-12-02 VITALS — BP 104/64 | HR 84 | Temp 98.3°F | Ht 60.0 in | Wt 205.2 lb

## 2020-12-02 DIAGNOSIS — Z79891 Long term (current) use of opiate analgesic: Secondary | ICD-10-CM | POA: Diagnosis not present

## 2020-12-02 DIAGNOSIS — M7061 Trochanteric bursitis, right hip: Secondary | ICD-10-CM | POA: Insufficient documentation

## 2020-12-02 DIAGNOSIS — M47816 Spondylosis without myelopathy or radiculopathy, lumbar region: Secondary | ICD-10-CM | POA: Diagnosis present

## 2020-12-02 DIAGNOSIS — M48061 Spinal stenosis, lumbar region without neurogenic claudication: Secondary | ICD-10-CM | POA: Insufficient documentation

## 2020-12-02 DIAGNOSIS — G894 Chronic pain syndrome: Secondary | ICD-10-CM | POA: Insufficient documentation

## 2020-12-02 DIAGNOSIS — Z5181 Encounter for therapeutic drug level monitoring: Secondary | ICD-10-CM | POA: Insufficient documentation

## 2020-12-02 DIAGNOSIS — M797 Fibromyalgia: Secondary | ICD-10-CM | POA: Insufficient documentation

## 2020-12-02 DIAGNOSIS — M5416 Radiculopathy, lumbar region: Secondary | ICD-10-CM | POA: Insufficient documentation

## 2020-12-02 MED ORDER — HYDROCODONE-ACETAMINOPHEN 7.5-325 MG PO TABS: ORAL_TABLET | ORAL | 0 refills | Status: DC

## 2020-12-02 NOTE — Progress Notes (Signed)
Subjective:    Patient ID: Leah Barton, female    DOB: 1950-07-20, 70 y.o.   MRN: [id number]  HPI: Leah Barton is a 70 y.o. female who returns for follow up appointment for chronic pain and medication refill. She states her pain is located in her lower back and right hip pain, she reports increase intensity of right hip pain. She will be scheduled for right hip injection with Dr Riley Kill, she verbalizes understanding. She rates her pain 4. Her current exercise regime is walking and performing stretching exercises.  Leah Barton Morphine equivalent is 22.50 MME.   Oral Swab was Performed today.    Pain Inventory Average Pain 5 Pain Right Now 4 My pain is sharp, burning, tingling, aching, and numbing  In the last 24 hours, has pain interfered with the following? General activity 4 Relation with others 4 Enjoyment of life 6 What TIME of day is your pain at its worst? varies Sleep (in general) Fair  Pain is worse with: walking, bending, sitting, inactivity, standing, and some activites Pain improves with: rest, heat/ice, pacing activities, and medication Relief from Meds: 4  No family history on file. Social History   Socioeconomic History   Marital status: Married    Spouse name: Not on file   Number of children: Not on file   Years of education: Not on file   Highest education level: Not on file  Occupational History   Not on file  Tobacco Use   Smoking status: Never   Smokeless tobacco: Never  Vaping Use   Vaping Use: Never used  Substance and Sexual Activity   Alcohol use: No   Drug use: No   Sexual activity: Not on file  Other Topics Concern   Not on file  Social History Narrative   Not on file   Social Determinants of Health   Financial Resource Strain: Not on file  Food Insecurity: Not on file  Transportation Needs: Not on file  Physical Activity: Not on file  Stress: Not on file  Social Connections: Not on file   Past Surgical History:  Procedure  Laterality Date   ANKLE SURGERY     ASPIRATION OF ABSCESS     CHOLECYSTECTOMY     DE QUERVAIN'S RELEASE     grastricbypas     HERNIA REPAIR     NASAL SEPTUM SURGERY     TOTAL VAGINAL HYSTERECTOMY     TUBAL LIGATION     Past Surgical History:  Procedure Laterality Date   ANKLE SURGERY     ASPIRATION OF ABSCESS     CHOLECYSTECTOMY     DE QUERVAIN'S RELEASE     grastricbypas     HERNIA REPAIR     NASAL SEPTUM SURGERY     TOTAL VAGINAL HYSTERECTOMY     TUBAL LIGATION     No past medical history on file. BP 104/64   Pulse 84   Temp 98.3 F (36.8 C)   Ht 5' (1.524 m)   Wt 205 lb 3.2 oz (93.1 kg)   SpO2 95%   BMI 40.08 kg/m   Opioid Risk Score:   Fall Risk Score:  `1  Depression screen [facility] 2/9  Depression screen [facility] 2/9 12/02/2020 07/21/2020 05/15/2020 10/23/2019 06/29/2019 08/15/2018 07/05/2018  Decreased Interest 1 1 1 1 3 1 1   Down, Depressed, Hopeless 1 1 1 1 3 1 1   PHQ - 2 Score 2 2 2 2 6 2 2   Altered sleeping - - - - - - -  Tired, decreased energy - - - - - - -  Change in appetite - - - - - - -  Feeling bad or failure about yourself  - - - - - - -  Trouble concentrating - - - - - - -  Moving slowly or fidgety/restless - - - - - - -  Suicidal thoughts - - - - 1 - -  PHQ-9 Score - - - - - - -  Difficult doing work/chores - - - - - - -      Review of Systems  Musculoskeletal:  Positive for back pain.       Shoulder pain and elbow pain  All other systems reviewed and are negative.     Objective:   Physical Exam Vitals and nursing note reviewed.  Constitutional:      Appearance: Normal appearance. She is obese.  Cardiovascular:     Rate and Rhythm: Normal rate and regular rhythm.     Pulses: Normal pulses.     Heart sounds: Normal heart sounds.  Pulmonary:     Effort: Pulmonary effort is normal.     Breath sounds: Normal breath sounds.  Musculoskeletal:     Cervical back: Normal range of motion and neck supple.     Comments: Normal Muscle Bulk and Muscle  Testing Reveals:  Upper Extremities: Full ROM and Muscle Strength 5/5 Lumbar Paraspinal Tenderness: L-3-L-5 Lower Extremities: Full ROM and Muscle Strength 5/5 Arises from chair slowly Antalgic  Gait     Skin:    General: Skin is warm and dry.  Neurological:     Mental Status: She is alert and oriented to person, place, and time.  Psychiatric:        Mood and Affect: Mood normal.        Behavior: Behavior normal.         Assessment & Plan:  1. Chronic low back pain with substantial spondylosis and scoliosis by MRI per Dr. Riley Kill note. 12/02/2020. Refilled: Hydrocodone 7.5/325 mg one tablet every 8 hours as needed #90. Second script sent for the following month.  We will continue the opioid monitoring program, this consists of regular clinic visits, examinations, urine drug screen, pill counts as well as use of [place] Controlled Substance Reporting system. A 12 month History has been reviewed on the [place] Controlled Substance Reporting System on 12/02/2020. 2. Lumbar Facet Arthropathy/ SI Joint Dysfunction: Continue with Facet Stretches: S/P  MBB on 07/12/2017. 12/02/2020.. 3. Lumbar Radiculitis: Continue Gabapentin: Continue HEP as Tolerated. Continue to Monitor. 12/02/2020 4. Fibromyalgia: Continue Gabapentin and HEP as Tolerated. 09/202022. 5. Right  Greater Trochanter Bursitis: Scheduled for Cortisone injection with Dr Riley Kill .Continue to Alternate with Ice and Heat Therapy. Continue to monitor. 12/02/2020   F/U in 2 Months

## 2020-12-09 LAB — DRUG TOX MONITOR 1 W/CONF, ORAL FLD
Amobarbital: NEGATIVE ng/mL (ref <10)
Amphetamines: NEGATIVE ng/mL (ref <10)
Barbiturates: POSITIVE ng/mL — AB (ref <10)
Benzodiazepines: NEGATIVE ng/mL (ref <0.50)
Buprenorphine: NEGATIVE ng/mL (ref <0.10)
Butalbital: NEGATIVE ng/mL (ref <10)
Cocaine: NEGATIVE ng/mL (ref <5.0)
Codeine: NEGATIVE ng/mL (ref <2.5)
Dihydrocodeine: 9.9 ng/mL — ABNORMAL HIGH (ref <2.5)
Fentanyl: NEGATIVE ng/mL (ref <0.10)
Heroin Metabolite: NEGATIVE ng/mL (ref <1.0)
Hydrocodone: 80.5 ng/mL — ABNORMAL HIGH (ref <2.5)
Hydromorphone: NEGATIVE ng/mL (ref <2.5)
MARIJUANA: NEGATIVE ng/mL (ref <2.5)
MDMA: NEGATIVE ng/mL (ref <10)
Meprobamate: NEGATIVE ng/mL (ref <2.5)
Methadone: NEGATIVE ng/mL (ref <5.0)
Morphine: NEGATIVE ng/mL (ref <2.5)
Nicotine Metabolite: NEGATIVE ng/mL (ref <5.0)
Norhydrocodone: 17 ng/mL — ABNORMAL HIGH (ref <2.5)
Noroxycodone: NEGATIVE ng/mL (ref <2.5)
Opiates: POSITIVE ng/mL — AB (ref <2.5)
Oxycodone: NEGATIVE ng/mL (ref <2.5)
Oxymorphone: NEGATIVE ng/mL (ref <2.5)
Pentobarbital: NEGATIVE ng/mL (ref <10)
Phencyclidine: NEGATIVE ng/mL (ref <10)
Phenobarbital: 89 ng/mL — ABNORMAL HIGH (ref <10)
Secobarbital: NEGATIVE ng/mL (ref <10)
Tapentadol: NEGATIVE ng/mL (ref <5.0)
Tramadol: NEGATIVE ng/mL (ref <5.0)
Zolpidem: NEGATIVE ng/mL (ref <5.0)

## 2020-12-09 LAB — DRUG TOX ALC METAB W/CON, ORAL FLD: Alcohol Metabolite: NEGATIVE ng/mL (ref <25)

## 2020-12-12 ENCOUNTER — Ambulatory Visit: Payer: Medicare Other | Admitting: Registered Nurse

## 2020-12-15 ENCOUNTER — Telehealth: Payer: Self-pay | Admitting: *Deleted

## 2020-12-15 NOTE — Telephone Encounter (Signed)
Oral swab drug screen was consistent for prescribed medications.  ?

## 2021-01-26 ENCOUNTER — Other Ambulatory Visit: Payer: Self-pay

## 2021-01-26 ENCOUNTER — Encounter: Payer: Medicare Other | Attending: Physical Medicine & Rehabilitation | Admitting: Registered Nurse

## 2021-01-26 ENCOUNTER — Encounter: Payer: Self-pay | Admitting: Registered Nurse

## 2021-01-26 ENCOUNTER — Ambulatory Visit: Payer: Medicare Other | Admitting: Registered Nurse

## 2021-01-26 VITALS — BP 117/76 | HR 88 | Temp 97.9°F | Ht 60.0 in | Wt 200.0 lb

## 2021-01-26 DIAGNOSIS — Z5181 Encounter for therapeutic drug level monitoring: Secondary | ICD-10-CM | POA: Diagnosis present

## 2021-01-26 DIAGNOSIS — M79644 Pain in right finger(s): Secondary | ICD-10-CM | POA: Diagnosis present

## 2021-01-26 DIAGNOSIS — M7061 Trochanteric bursitis, right hip: Secondary | ICD-10-CM

## 2021-01-26 DIAGNOSIS — M797 Fibromyalgia: Secondary | ICD-10-CM

## 2021-01-26 DIAGNOSIS — M47816 Spondylosis without myelopathy or radiculopathy, lumbar region: Secondary | ICD-10-CM | POA: Diagnosis not present

## 2021-01-26 DIAGNOSIS — Z79891 Long term (current) use of opiate analgesic: Secondary | ICD-10-CM

## 2021-01-26 DIAGNOSIS — M7062 Trochanteric bursitis, left hip: Secondary | ICD-10-CM | POA: Diagnosis present

## 2021-01-26 DIAGNOSIS — M48061 Spinal stenosis, lumbar region without neurogenic claudication: Secondary | ICD-10-CM | POA: Diagnosis not present

## 2021-01-26 DIAGNOSIS — M5416 Radiculopathy, lumbar region: Secondary | ICD-10-CM | POA: Diagnosis not present

## 2021-01-26 DIAGNOSIS — G894 Chronic pain syndrome: Secondary | ICD-10-CM | POA: Diagnosis present

## 2021-01-26 MED ORDER — HYDROCODONE-ACETAMINOPHEN 7.5-325 MG PO TABS: ORAL_TABLET | ORAL | 0 refills | Status: DC

## 2021-01-26 MED ORDER — GABAPENTIN 300 MG PO CAPS: 300.0000 mg | ORAL_CAPSULE | Freq: Two times a day (BID) | ORAL | 2 refills | Status: DC

## 2021-01-26 NOTE — Progress Notes (Signed)
Subjective:    Patient ID: Leah Barton, female    DOB: 08/06/1950, 69 y.o.   MRN: [id number]  HPI: Leah Barton is a 70 y.o. female who returns for follow up appointment for chronic pain and medication refill. She states her pain is located in her right thumb for the last week, she is calling her orthopedist to schedule an appointment she states. She also states she has pain in her lower back and bilateral hips R>L. She rates her pain 5. Her current exercise regime is walking and performing stretching exercises.    Leah Barton Morphine equivalent is 22.50 MME.She is also prescribed Clonazepam  by Leah Barton .We have discussed the black box warning of using opioids and benzodiazepines. I highlighted the dangers of using these drugs together and discussed the adverse events including respiratory suppression, overdose, cognitive impairment and importance of compliance with current regimen. We will continue to monitor and adjust as indicated.    Last Oral Swab was Performed on 12/02/2020, it was consistent.    Pain Inventory Average Pain 5 Pain Right Now 5 My pain is constant, sharp, burning, dull, stabbing, tingling, and aching  In the last 24 hours, has pain interfered with the following? General activity 8 Relation with others 8 Enjoyment of life 8 What TIME of day is your pain at its worst? varies Sleep (in general) Poor  Pain is worse with: walking, bending, sitting, inactivity, standing, and some activites Pain improves with: medication Relief from Meds: 3  No family history on file. Social History   Socioeconomic History   Marital status: Married    Spouse name: Not on file   Number of children: Not on file   Years of education: Not on file   Highest education level: Not on file  Occupational History   Not on file  Tobacco Use   Smoking status: Never   Smokeless tobacco: Never  Vaping Use   Vaping Use: Never used  Substance and Sexual Activity   Alcohol use: No    Drug use: No   Sexual activity: Not on file  Other Topics Concern   Not on file  Social History Narrative   Not on file   Social Determinants of Health   Financial Resource Strain: Not on file  Food Insecurity: Not on file  Transportation Needs: Not on file  Physical Activity: Not on file  Stress: Not on file  Social Connections: Not on file   Past Surgical History:  Procedure Laterality Date   ANKLE SURGERY     ASPIRATION OF ABSCESS     CHOLECYSTECTOMY     DE QUERVAIN'S RELEASE     grastricbypas     HERNIA REPAIR     NASAL SEPTUM SURGERY     TOTAL VAGINAL HYSTERECTOMY     TUBAL LIGATION     Past Surgical History:  Procedure Laterality Date   ANKLE SURGERY     ASPIRATION OF ABSCESS     CHOLECYSTECTOMY     DE QUERVAIN'S RELEASE     grastricbypas     HERNIA REPAIR     NASAL SEPTUM SURGERY     TOTAL VAGINAL HYSTERECTOMY     TUBAL LIGATION     No past medical history on file. There were no vitals taken for this visit.  Opioid Risk Score:   Fall Risk Score:  `1  Depression screen PHQ 2/9  Depression screen [facility] 2/9 12/02/2020 07/21/2020 05/15/2020 10/23/2019 06/29/2019 08/15/2018 07/05/2018  Decreased Interest 1 1 1  1 3 1 1   Down, Depressed, Hopeless 1 1 1 1 3 1 1   PHQ - 2 Score 2 2 2 2 6 2 2   Altered sleeping - - - - - - -  Tired, decreased energy - - - - - - -  Change in appetite - - - - - - -  Feeling bad or failure about yourself  - - - - - - -  Trouble concentrating - - - - - - -  Moving slowly or fidgety/restless - - - - - - -  Suicidal thoughts - - - - 1 - -  PHQ-9 Score - - - - - - -  Difficult doing work/chores - - - - - - -    Review of Systems  Musculoskeletal:  Positive for back pain and gait problem.       Pain in right thumb, pain in both shoulders, pain in both hips  All other systems reviewed and are negative.     Objective:   Physical Exam Vitals and nursing note reviewed.  Constitutional:      Appearance: Normal appearance. She is obese.   Cardiovascular:     Rate and Rhythm: Normal rate and regular rhythm.     Pulses: Normal pulses.     Heart sounds: Normal heart sounds.  Pulmonary:     Effort: Pulmonary effort is normal.     Breath sounds: Normal breath sounds.  Musculoskeletal:     Cervical back: Normal range of motion and neck supple.     Comments: Normal Muscle Bulk and Muscle Testing Reveals:  Upper Extremities: Full ROM and Muscle Strength 5/5  Bilateral AC Joint Tenderness Lumbar Paraspinal Tenderness: L-3-L-5 Bilateral Greater Trochanter Tenderness Lower Extremities: Full ROM and Muscle Strength 5/5 Arises From Table Slowly using cane for support Antalgic  Gait     Skin:    General: Skin is warm and dry.  Neurological:     Mental Status: She is alert and oriented to person, place, and time.  Psychiatric:        Mood and Affect: Mood normal.        Behavior: Behavior normal.         Assessment & Plan:  1. Chronic low back pain with substantial spondylosis and scoliosis by MRI per Dr. note. 01/26/2021. Refilled: Hydrocodone 7.5/325 mg one tablet every 8 hours as needed #90. Second script sent for the following month.  We will continue the opioid monitoring program, this consists of regular clinic visits, examinations, urine drug screen, pill counts as well as use of [place] Controlled Substance Reporting system. A 12 month History has been reviewed on the 01/28/2021 Controlled Substance Reporting System on 01/26/2021. 2. Lumbar Facet Arthropathy/ SI Joint Dysfunction: Continue with Facet Stretches: S/P  MBB on 07/12/2017. 01/26/2021.. 3. Lumbar Radiculitis: Continue Gabapentin: Continue HEP as Tolerated. Continue to Monitor. 11/14//2022 4. Fibromyalgia: Continue Gabapentin and HEP as Tolerated. 11/142022. 5. Right  Greater Trochanter Bursitis: Leah Barton states she will be calling her orthopedist regarding cortisone injection, she will call office if she want to scheduled an appointment  with Dr, 04-09-1971 for injection. Continue to Alternate with Ice and Heat Therapy. Continue to monitor. 01/26/2021 6. Right Thumb Pain: Leah Barton will call her orthopedist to scheduled an appointment she states.    F/U in 2 Months

## 2021-02-02 ENCOUNTER — Ambulatory Visit: Payer: Medicare Other | Admitting: Registered Nurse

## 2021-02-11 ENCOUNTER — Ambulatory Visit: Payer: Medicare Other | Admitting: Physical Medicine & Rehabilitation

## 2021-03-24 ENCOUNTER — Ambulatory Visit: Payer: Medicare Other | Admitting: Registered Nurse

## 2021-03-27 ENCOUNTER — Other Ambulatory Visit: Payer: Self-pay

## 2021-03-27 ENCOUNTER — Encounter: Payer: Self-pay | Admitting: Registered Nurse

## 2021-03-27 ENCOUNTER — Encounter: Payer: Medicare Other | Attending: Physical Medicine & Rehabilitation | Admitting: Registered Nurse

## 2021-03-27 VITALS — BP 127/76 | HR 81 | Ht 60.0 in | Wt 200.0 lb

## 2021-03-27 DIAGNOSIS — M797 Fibromyalgia: Secondary | ICD-10-CM | POA: Insufficient documentation

## 2021-03-27 DIAGNOSIS — M7061 Trochanteric bursitis, right hip: Secondary | ICD-10-CM | POA: Diagnosis present

## 2021-03-27 DIAGNOSIS — M7062 Trochanteric bursitis, left hip: Secondary | ICD-10-CM | POA: Insufficient documentation

## 2021-03-27 DIAGNOSIS — G894 Chronic pain syndrome: Secondary | ICD-10-CM | POA: Diagnosis present

## 2021-03-27 DIAGNOSIS — Z79891 Long term (current) use of opiate analgesic: Secondary | ICD-10-CM | POA: Diagnosis present

## 2021-03-27 DIAGNOSIS — Z5181 Encounter for therapeutic drug level monitoring: Secondary | ICD-10-CM | POA: Insufficient documentation

## 2021-03-27 DIAGNOSIS — M47816 Spondylosis without myelopathy or radiculopathy, lumbar region: Secondary | ICD-10-CM | POA: Insufficient documentation

## 2021-03-27 MED ORDER — HYDROCODONE-ACETAMINOPHEN 7.5-325 MG PO TABS: ORAL_TABLET | ORAL | 0 refills | Status: DC

## 2021-03-27 NOTE — Progress Notes (Signed)
Subjective:    Patient ID: Leah Barton, female    DOB: 1950-05-28, 70 y.o.   MRN: [id number]  HPI: Leah Barton is a 71 y.o. female who returns for follow up appointment for chronic pain and medication refill. She states her pain is located in her  right thumb, post surgical pain, lower back and bilateral hips. She rates her pain 5. Her current exercise regime is walking and performing stretching exercises.  [name] Morphine equivalent is 22.50  MME. She  is also prescribed Clonazepam  by Dr. Vernell Leah Barton .We have discussed the black box warning of using opioids and benzodiazepines. I highlighted the dangers of using these drugs together and discussed the adverse events including respiratory suppression, overdose, cognitive impairment and importance of compliance with current regimen. We will continue to monitor and adjust as indicated.   Oral Swab was Performed Today.     Pain Inventory Average Pain 5 Pain Right Now 5 My pain is constant, sharp, burning, dull, stabbing, tingling, and aching  In the last 24 hours, has pain interfered with the following? General activity 4 Relation with others 4 Enjoyment of life 8 What TIME of day is your pain at its worst? varies Sleep (in general) Fair  Pain is worse with: walking, bending, sitting, inactivity, standing, and some activites Pain improves with: medication Relief from Meds: 5  No family history Barton file. Social History   Socioeconomic History   Marital status: Married    Spouse name: Not Barton file   Number of children: Not Barton file   Years of education: Not Barton file   Highest education level: Not Barton file  Occupational History   Not Barton file  Tobacco Use   Smoking status: Never   Smokeless tobacco: Never  Vaping Use   Vaping Use: Never used  Substance and Sexual Activity   Alcohol use: No   Drug use: No   Sexual activity: Not Barton file  Other Topics Concern   Not Barton file  Social History Narrative   Not Barton file   Social  Determinants of Health   Financial Resource Strain: Not Barton file  Food Insecurity: Not Barton file  Transportation Needs: Not Barton file  Physical Activity: Not Barton file  Stress: Not Barton file  Social Connections: Not Barton file   Past Surgical History:  Procedure Laterality Date   ANKLE SURGERY     ASPIRATION OF ABSCESS     CHOLECYSTECTOMY     DE QUERVAIN'S RELEASE     grastricbypas     HERNIA REPAIR     NASAL SEPTUM SURGERY     TOTAL VAGINAL HYSTERECTOMY     TUBAL LIGATION     Past Surgical History:  Procedure Laterality Date   ANKLE SURGERY     ASPIRATION OF ABSCESS     CHOLECYSTECTOMY     DE QUERVAIN'S RELEASE     grastricbypas     HERNIA REPAIR     NASAL SEPTUM SURGERY     TOTAL VAGINAL HYSTERECTOMY     TUBAL LIGATION     No past medical history Barton file. BP 127/76    Pulse 81    Ht 5' (1.524 m)    Wt 200 lb (90.7 kg)    SpO2 93%    BMI 39.06 kg/m   Opioid Risk Score:   Fall Risk Score:  `1  Depression screen [facility] 2/9  Depression screen [facility] 2/9 01/26/2021 12/02/2020 07/21/2020 05/15/2020 10/23/2019 06/29/2019 08/15/2018  Decreased Interest 1  1 1 1 1 3 1   Down, Depressed, Hopeless 1 1 1 1 1 3 1   PHQ - 2 Score 2 2 2 2 2 6 2   Altered sleeping - - - - - - -  Tired, decreased energy - - - - - - -  Change in appetite - - - - - - -  Feeling bad or failure about yourself  - - - - - - -  Trouble concentrating - - - - - - -  Moving slowly or fidgety/restless - - - - - - -  Suicidal thoughts - - - - - 1 -  PHQ-9 Score - - - - - - -  Difficult doing work/chores - - - - - - -     Review of Systems  Musculoskeletal:  Positive for back pain.  All other systems reviewed and are negative.     Objective:   Physical Exam Vitals and nursing note reviewed.  Constitutional:      Appearance: Normal appearance.  Cardiovascular:     Rate and Rhythm: Normal rate and regular rhythm.     Pulses: Normal pulses.     Heart sounds: Normal heart sounds.  Pulmonary:     Effort: Pulmonary effort is  normal.     Breath sounds: Normal breath sounds.  Musculoskeletal:     Cervical back: Normal range of motion and neck supple.     Comments: Normal Muscle Bulk and Muscle Testing Reveals:  Upper Extremities: Full ROM and Muscle Strength 5/5  Thoracic Paraspinal Tenderness: T-7-T-9 Lumbar Paraspinal Tenderness: L-4-L-5 Bilateral Greater Trochanter Tenderness: R>L Lower Extremities: Full ROM and Muscle Strength 5/5 Arises From Table Slowly using Cane for support Antalgic Gait     Skin:    General: Skin is warm and dry.  Neurological:     Mental Status: She is alert and oriented to person, place, and time.  Psychiatric:        Mood and Affect: Mood normal.        Behavior: Behavior normal.         Assessment & Plan:  1. Chronic low back pain with substantial spondylosis and scoliosis by MRI per Dr. note. 03/27/2021. Refilled: Hydrocodone 7.5/325 mg one tablet every 8 hours as needed #90. Second script sent for the following month.  We will continue the opioid monitoring program, this consists of regular clinic visits, examinations, urine drug screen, pill counts as well as use of [place] Controlled Substance Reporting system. A 12 month History has been reviewed Barton the 03/29/2021 Controlled Substance Reporting System Barton 03/27/2021. 2. Lumbar Facet Arthropathy/ SI Joint Dysfunction: Continue with Facet Stretches: S/P  MBB Barton 07/12/2017. 03/27/2021.. 3. Lumbar Radiculitis: Continue Gabapentin: Continue HEP as Tolerated. Continue to Monitor. 01/13//2023 4. Fibromyalgia: Continue Gabapentin and HEP as Tolerated. 03/27/2021. 5. Bilateral Greater Trochanter Bursitis R>L: [name] states she will be calling her orthopedist regarding cortisone injection, she will call office if she want to scheduled an appointment with Dr, 06-25-1991 for injection. Continue to Alternate with Ice and Heat Therapy. Continue to monitor. 03/27/2021 6. Right Thumb Pain: Post Operative Pain: Surgeon  Following. Continue to Monitor.    F/U in 2 Months

## 2021-03-30 ENCOUNTER — Ambulatory Visit: Payer: Medicare Other | Admitting: Registered Nurse

## 2021-04-02 ENCOUNTER — Ambulatory Visit: Payer: Medicare Other | Admitting: Registered Nurse

## 2021-04-04 LAB — DRUG TOX ALC METAB W/CON, ORAL FLD: Alcohol Metabolite: NEGATIVE ng/mL (ref <25)

## 2021-04-04 LAB — DRUG TOX MONITOR 1 W/CONF, ORAL FLD
Amobarbital: NEGATIVE ng/mL (ref <10)
Amphetamines: NEGATIVE ng/mL (ref <10)
Barbiturates: POSITIVE ng/mL — AB (ref <10)
Benzodiazepines: NEGATIVE ng/mL (ref <0.50)
Buprenorphine: NEGATIVE ng/mL (ref <0.10)
Butalbital: NEGATIVE ng/mL (ref <10)
Cocaine: NEGATIVE ng/mL (ref <5.0)
Codeine: NEGATIVE ng/mL (ref <2.5)
Dihydrocodeine: 2.8 ng/mL — ABNORMAL HIGH (ref <2.5)
Fentanyl: NEGATIVE ng/mL (ref <0.10)
Heroin Metabolite: NEGATIVE ng/mL (ref <1.0)
Hydrocodone: 35.2 ng/mL — ABNORMAL HIGH (ref <2.5)
Hydromorphone: NEGATIVE ng/mL (ref <2.5)
MARIJUANA: NEGATIVE ng/mL (ref <2.5)
MDMA: NEGATIVE ng/mL (ref <10)
Meprobamate: NEGATIVE ng/mL (ref <2.5)
Methadone: NEGATIVE ng/mL (ref <5.0)
Morphine: NEGATIVE ng/mL (ref <2.5)
Nicotine Metabolite: NEGATIVE ng/mL (ref <5.0)
Norhydrocodone: 5.9 ng/mL — ABNORMAL HIGH (ref <2.5)
Noroxycodone: NEGATIVE ng/mL (ref <2.5)
Opiates: POSITIVE ng/mL — AB (ref <2.5)
Oxycodone: NEGATIVE ng/mL (ref <2.5)
Oxymorphone: NEGATIVE ng/mL (ref <2.5)
Pentobarbital: NEGATIVE ng/mL (ref <10)
Phencyclidine: NEGATIVE ng/mL (ref <10)
Phenobarbital: 76 ng/mL — ABNORMAL HIGH (ref <10)
Secobarbital: NEGATIVE ng/mL (ref <10)
Tapentadol: NEGATIVE ng/mL (ref <5.0)
Tramadol: NEGATIVE ng/mL (ref <5.0)
Zolpidem: NEGATIVE ng/mL (ref <5.0)

## 2021-04-14 ENCOUNTER — Telehealth: Payer: Self-pay | Admitting: *Deleted

## 2021-04-14 NOTE — Telephone Encounter (Signed)
Urine drug screen for this encounter is consistent for prescribed medication 

## 2021-05-25 ENCOUNTER — Other Ambulatory Visit: Payer: Self-pay

## 2021-05-25 ENCOUNTER — Encounter: Payer: Self-pay | Admitting: Registered Nurse

## 2021-05-25 ENCOUNTER — Encounter: Payer: Medicare Other | Attending: Physical Medicine & Rehabilitation | Admitting: Registered Nurse

## 2021-05-25 VITALS — BP 99/63 | HR 86 | Ht 60.0 in | Wt 199.0 lb

## 2021-05-25 DIAGNOSIS — M7062 Trochanteric bursitis, left hip: Secondary | ICD-10-CM | POA: Insufficient documentation

## 2021-05-25 DIAGNOSIS — M47816 Spondylosis without myelopathy or radiculopathy, lumbar region: Secondary | ICD-10-CM | POA: Diagnosis present

## 2021-05-25 DIAGNOSIS — M25561 Pain in right knee: Secondary | ICD-10-CM | POA: Insufficient documentation

## 2021-05-25 DIAGNOSIS — M542 Cervicalgia: Secondary | ICD-10-CM | POA: Diagnosis not present

## 2021-05-25 DIAGNOSIS — M4726 Other spondylosis with radiculopathy, lumbar region: Secondary | ICD-10-CM | POA: Insufficient documentation

## 2021-05-25 DIAGNOSIS — M79644 Pain in right finger(s): Secondary | ICD-10-CM | POA: Insufficient documentation

## 2021-05-25 DIAGNOSIS — W19XXXA Unspecified fall, initial encounter: Secondary | ICD-10-CM | POA: Diagnosis not present

## 2021-05-25 DIAGNOSIS — Y939 Activity, unspecified: Secondary | ICD-10-CM | POA: Diagnosis not present

## 2021-05-25 DIAGNOSIS — M419 Scoliosis, unspecified: Secondary | ICD-10-CM | POA: Insufficient documentation

## 2021-05-25 DIAGNOSIS — G8918 Other acute postprocedural pain: Secondary | ICD-10-CM | POA: Insufficient documentation

## 2021-05-25 DIAGNOSIS — W19XXXD Unspecified fall, subsequent encounter: Secondary | ICD-10-CM | POA: Diagnosis not present

## 2021-05-25 DIAGNOSIS — Z79891 Long term (current) use of opiate analgesic: Secondary | ICD-10-CM | POA: Insufficient documentation

## 2021-05-25 DIAGNOSIS — M533 Sacrococcygeal disorders, not elsewhere classified: Secondary | ICD-10-CM | POA: Diagnosis not present

## 2021-05-25 DIAGNOSIS — M5459 Other low back pain: Secondary | ICD-10-CM | POA: Insufficient documentation

## 2021-05-25 DIAGNOSIS — M25552 Pain in left hip: Secondary | ICD-10-CM | POA: Insufficient documentation

## 2021-05-25 DIAGNOSIS — M5416 Radiculopathy, lumbar region: Secondary | ICD-10-CM

## 2021-05-25 DIAGNOSIS — M25551 Pain in right hip: Secondary | ICD-10-CM | POA: Insufficient documentation

## 2021-05-25 DIAGNOSIS — M48061 Spinal stenosis, lumbar region without neurogenic claudication: Secondary | ICD-10-CM | POA: Diagnosis not present

## 2021-05-25 DIAGNOSIS — Z79899 Other long term (current) drug therapy: Secondary | ICD-10-CM | POA: Insufficient documentation

## 2021-05-25 DIAGNOSIS — Z760 Encounter for issue of repeat prescription: Secondary | ICD-10-CM | POA: Diagnosis not present

## 2021-05-25 DIAGNOSIS — G894 Chronic pain syndrome: Secondary | ICD-10-CM | POA: Insufficient documentation

## 2021-05-25 DIAGNOSIS — M7061 Trochanteric bursitis, right hip: Secondary | ICD-10-CM | POA: Diagnosis present

## 2021-05-25 DIAGNOSIS — Y92009 Unspecified place in unspecified non-institutional (private) residence as the place of occurrence of the external cause: Secondary | ICD-10-CM | POA: Diagnosis not present

## 2021-05-25 DIAGNOSIS — M2550 Pain in unspecified joint: Secondary | ICD-10-CM | POA: Diagnosis not present

## 2021-05-25 DIAGNOSIS — M797 Fibromyalgia: Secondary | ICD-10-CM | POA: Diagnosis present

## 2021-05-25 DIAGNOSIS — M25562 Pain in left knee: Secondary | ICD-10-CM | POA: Diagnosis not present

## 2021-05-25 DIAGNOSIS — Z5181 Encounter for therapeutic drug level monitoring: Secondary | ICD-10-CM | POA: Insufficient documentation

## 2021-05-25 MED ORDER — HYDROCODONE-ACETAMINOPHEN 7.5-325 MG PO TABS: ORAL_TABLET | ORAL | 0 refills | Status: DC

## 2021-05-25 NOTE — Progress Notes (Signed)
? ?Subjective:  ? ? Patient ID: Leah Barton, female    DOB: [birth year], 71 y.o.   MRN: [id number] ? ?HPI: Leah Barton is a 71 y.o. female who returns for follow up appointment for chronic pain and medication refill. She states her pain is located in her lower back, bilateral hip pain and bilateral knee pain. She also reports generalized joint pain. She  rates her pain 7. Her current exercise regime is walking short distances  in her home with walker.  ? ?Leah Barton states yesterday she was walking in her home, and  lost her balanced and landed on her buttocks. She was able to pick herself up. She didn't seek medical attention. Also states she didn't have her cane with her. Educated on falls prevention,  also instructed to use her cane at all times, she verbalizes understanding.  ? ?Leah Barton Morphine equivalent is 22.50 MME.   Last Oral Swab was Performed on 03/27/2021, it was consistent.  ?  ?Pain Inventory ?Average Pain 5 ?Pain Right Now 7 ?My pain is sharp, burning, dull, stabbing, tingling, and aching ? ?In the last 24 hours, has pain interfered with the following? ?General activity 7 ?Relation with others 7 ?Enjoyment of life 8 ?What TIME of day is your pain at its worst? varies ?Sleep (in general) Fair ? ?Pain is worse with: walking, bending, sitting, standing, and some activites ?Pain improves with: rest, pacing activities, medication, and injections ?Relief from Meds: 4 ? ?No family history on file. ?Social History  ? ?Socioeconomic History  ? Marital status: Married  ?  Spouse name: Not on file  ? Number of children: Not on file  ? Years of education: Not on file  ? Highest education level: Not on file  ?Occupational History  ? Not on file  ?Tobacco Use  ? Smoking status: Never  ? Smokeless tobacco: Never  ?Vaping Use  ? Vaping Use: Never used  ?Substance and Sexual Activity  ? Alcohol use: No  ? Drug use: No  ? Sexual activity: Not on file  ?Other Topics Concern  ? Not on file  ?Social History Narrative   ? Not on file  ? ?Social Determinants of Health  ? ?Financial Resource Strain: Not on file  ?Food Insecurity: Not on file  ?Transportation Needs: Not on file  ?Physical Activity: Not on file  ?Stress: Not on file  ?Social Connections: Not on file  ? ?Past Surgical History:  ?Procedure Laterality Date  ? ANKLE SURGERY    ? ASPIRATION OF ABSCESS    ? CHOLECYSTECTOMY    ? DE QUERVAIN'S RELEASE    ? grastricbypas    ? HERNIA REPAIR    ? NASAL SEPTUM SURGERY    ? TOTAL VAGINAL HYSTERECTOMY    ? TUBAL LIGATION    ? ?Past Surgical History:  ?Procedure Laterality Date  ? ANKLE SURGERY    ? ASPIRATION OF ABSCESS    ? CHOLECYSTECTOMY    ? DE QUERVAIN'S RELEASE    ? grastricbypas    ? HERNIA REPAIR    ? NASAL SEPTUM SURGERY    ? TOTAL VAGINAL HYSTERECTOMY    ? TUBAL LIGATION    ? ?No past medical history on file. ?BP 99/63   Pulse 86   Ht 5' (1.524 m)   Wt 199 lb (90.3 kg)   SpO2 98%   BMI 38.86 kg/m?  ? ?Opioid Risk Score:   ?Fall Risk Score:  `1 ? ?Depression screen PHQ 2/9 ? ?Depression screen  [facility] 2/9 05/25/2021 01/26/2021 12/02/2020 07/21/2020 05/15/2020 10/23/2019 06/29/2019  ?Decreased Interest 0 1 1 1 1 1 3   ?Down, Depressed, Hopeless 0 1 1 1 1 1 3   ?PHQ - 2 Score 0 2 2 2 2 2 6   ?Altered sleeping - - - - - - -  ?Tired, decreased energy - - - - - - -  ?Change in appetite - - - - - - -  ?Feeling bad or failure about yourself  - - - - - - -  ?Trouble concentrating - - - - - - -  ?Moving slowly or fidgety/restless - - - - - - -  ?Suicidal thoughts - - - - - - 1  ?PHQ-9 Score - - - - - - -  ?Difficult doing work/chores - - - - - - -  ?  ? ?Review of Systems  ?Musculoskeletal:  Positive for back pain, gait problem and neck pain.  ?     Pain in back of knees  ?All other systems reviewed and are negative. ? ?   ?Objective:  ? Physical Exam ?Vitals and nursing note reviewed.  ?Constitutional:   ?   Appearance: Normal appearance.  ?Cardiovascular:  ?   Rate and Rhythm: Normal rate and regular rhythm.  ?   Pulses: Normal pulses.  ?    Heart sounds: Normal heart sounds.  ?Pulmonary:  ?   Effort: Pulmonary effort is normal.  ?   Breath sounds: Normal breath sounds.  ?Musculoskeletal:  ?   Cervical back: Normal range of motion and neck supple.  ?   Comments: Normal Muscle Bulk and Muscle Testing Reveals:  ?Upper Extremities: Full ROM and Muscle Strength 5/5 ?Thoracic Paraspinal Tenderness: T-10-T-12 ?Lumbar Paraspinal Tenderness: L-3-L-5 ?Lower Extremities: Full ROM and Muscle Strength 5/5 ?Arises from Table slowly using cane for support ?Antalgic Gait  ?   ?Skin: ?   General: Skin is warm and dry.  ?Neurological:  ?   Mental Status: She is alert and oriented to person, place, and time.  ?Psychiatric:     ?   Mood and Affect: Mood normal.     ?   Behavior: Behavior normal.  ? ? ? ? ? ?   ?Assessment & Plan:  ?1. Chronic low back pain with substantial spondylosis and scoliosis by MRI per Dr. note. 05/25/2021. ?Refilled: Hydrocodone 7.5/325 mg one tablet every 8 hours as needed #90. Second script sent for the following month.  ?We will continue the opioid monitoring program, this consists of regular clinic visits, examinations, urine drug screen, pill counts as well as use of [place] Controlled Substance Reporting system. A 12 month History has been reviewed on the 05/27/2021 Controlled Substance Reporting System on 05/25/2021. ?2. Lumbar Facet Arthropathy/ SI Joint Dysfunction: Continue with Facet Stretches: S/P  MBB on 07/12/2017. 05/25/2021.05/27/2021 ?3. Lumbar Radiculitis: Continue Gabapentin: Continue HEP as Tolerated. Continue to Monitor. 03/13//2023 ?4. Fibromyalgia: Continue Gabapentin and HEP as Tolerated. 05/25/2021. ?5. Bilateral Greater Trochanter Bursitis R>L: Leah Barton states she will be calling her orthopedist regarding cortisone injection, she will call office if she want to scheduled an appointment with Dr, 02-02-1985 for injection. Continue to Alternate with Ice and Heat Therapy. Continue to monitor. 05/25/2021 ?6. Right Thumb  Pain: Post Operative Pain: Surgeon Following. Continue to Monitor.  ?  ?F/U in 2 Months ?  ?  ? ? ? ? ? ? ? ? ? ?

## 2021-07-22 ENCOUNTER — Encounter: Payer: Self-pay | Admitting: Physical Medicine & Rehabilitation

## 2021-07-22 ENCOUNTER — Encounter: Payer: Medicare Other | Attending: Physical Medicine & Rehabilitation | Admitting: Physical Medicine & Rehabilitation

## 2021-07-22 VITALS — BP 126/80 | HR 91 | Ht 60.0 in | Wt 207.0 lb

## 2021-07-22 DIAGNOSIS — M7061 Trochanteric bursitis, right hip: Secondary | ICD-10-CM | POA: Insufficient documentation

## 2021-07-22 DIAGNOSIS — M7062 Trochanteric bursitis, left hip: Secondary | ICD-10-CM | POA: Diagnosis present

## 2021-07-22 DIAGNOSIS — M4126 Other idiopathic scoliosis, lumbar region: Secondary | ICD-10-CM | POA: Insufficient documentation

## 2021-07-22 DIAGNOSIS — M47816 Spondylosis without myelopathy or radiculopathy, lumbar region: Secondary | ICD-10-CM | POA: Insufficient documentation

## 2021-07-22 MED ORDER — HYDROCODONE-ACETAMINOPHEN 7.5-325 MG PO TABS: ORAL_TABLET | ORAL | 0 refills | Status: DC

## 2021-07-22 NOTE — Patient Instructions (Signed)
PLEASE FEEL FREE TO CALL OUR OFFICE WITH ANY PROBLEMS OR QUESTIONS ([phone])      

## 2021-07-22 NOTE — Progress Notes (Signed)
? ?Subjective:  ? ? Patient ID: Leah Barton, female    DOB: 1950-03-26, 71 y.o.   MRN: [id number] ? ?HPI ? ?Mardel is here in follow up of her chronic pain. She continues to deal with pain in her low back and right hip. She admits to being overweight, lack of good exercise, care needs for her husband, etc. She is using a cane for balance ? ?She has had ongoing pain in her low back and right hip.  She has seen a specialist up in [place] for her low back.  She had a copy of her MRI scanned into the chart dated July 2022.  There is really multilevel lumbar facet disc and neuroforaminal disease.  Her biggest complaint is the pain along her right hip.  She sometimes has some radiation to her legs while she sleeps at night but in general the hip pain is most prominent without radiation past the knee. ? ?For pain relief she remains on hydrocodone as prescribed.  This does provide some element of relief.  She does feel that she is living from day-to-day however, from a pain standpoint.   ? ? ?Pain Inventory ?Average Pain 4 ?Pain Right Now 5 ?My pain is constant, sharp, burning, dull, stabbing, and tingling ? ?In the last 24 hours, has pain interfered with the following? ?General activity 4 ?Relation with others 4 ?Enjoyment of life 6 ?What TIME of day is your pain at its worst? morning , daytime, evening, and night ?Sleep (in general) Fair ? ?Pain is worse with: walking and standing ?Pain improves with: medication ?Relief from Meds: 4 ? ? ? ? ? ?History reviewed. No pertinent family history. ?Social History  ? ?Socioeconomic History  ? Marital status: Married  ?  Spouse name: Not on file  ? Number of children: Not on file  ? Years of education: Not on file  ? Highest education level: Not on file  ?Occupational History  ? Not on file  ?Tobacco Use  ? Smoking status: Never  ? Smokeless tobacco: Never  ?Vaping Use  ? Vaping Use: Never used  ?Substance and Sexual Activity  ? Alcohol use: No  ? Drug use: No  ? Sexual  activity: Not on file  ?Other Topics Concern  ? Not on file  ?Social History Narrative  ? Not on file  ? ?Social Determinants of Health  ? ?Financial Resource Strain: Not on file  ?Food Insecurity: Not on file  ?Transportation Needs: Not on file  ?Physical Activity: Not on file  ?Stress: Not on file  ?Social Connections: Not on file  ? ?Past Surgical History:  ?Procedure Laterality Date  ? ANKLE SURGERY    ? ASPIRATION OF ABSCESS    ? CHOLECYSTECTOMY    ? DE QUERVAIN'S RELEASE    ? grastricbypas    ? HERNIA REPAIR    ? NASAL SEPTUM SURGERY    ? TOTAL VAGINAL HYSTERECTOMY    ? TUBAL LIGATION    ? ?History reviewed. No pertinent past medical history. ?BP 126/80   Pulse 91   Ht 5' (1.524 m)   Wt 207 lb (93.9 kg)   SpO2 95%   BMI 40.43 kg/m?  ? ?Opioid Risk Score:   ?Fall Risk Score:  `1 ? ?Depression screen PHQ 2/9 ? ? ?  05/25/2021  ? 12:56 PM 01/26/2021  ?  1:26 PM 12/02/2020  ?  1:45 PM 07/21/2020  ?  1:41 PM 05/15/2020  ?  1:02 PM 10/23/2019  ?  1:38  PM 06/29/2019  ?  1:30 PM  ?Depression screen PHQ 2/9  ?Decreased Interest 0 1 1 1 1 1 3   ?Down, Depressed, Hopeless 0 1 1 1 1 1 3   ?PHQ - 2 Score 0 2 2 2 2 2 6   ?Suicidal thoughts       1  ? ? ?Review of Systems  ?Musculoskeletal:  Positive for back pain.  ?     Left hip pain, right arm pain, right hand pain  ?All other systems reviewed and are negative. ? ?   ?Objective:  ? Physical Exam ? ? ?General: Alert and oriented x 3, No apparent distress. obese ?HEENT: Head is normocephalic, atraumatic, PERRLA, EOMI, sclera anicteric, oral mucosa pink and moist, dentition intact, ext ear canals clear,  ?Neck: Supple without JVD or lymphadenopathy ?Heart: Reg rate and rhythm. No murmurs rubs or gallops ?Chest: CTA bilaterally without wheezes, rales, or rhonchi; no distress ?Abdomen: Soft, non-tender, non-distended, bowel sounds positive. ?Extremities: No clubbing, cyanosis, or edema. Pulses are 2+ ?Psych: Pt's affect is appropriate. Pt is cooperative ?Skin: Clean and intact  without signs of breakdown ?Neuro:  Alert and oriented x 3. Normal insight and awareness. Intact Memory. Normal language and speech. Cranial nerve exam unremarkable  ?Musculoskeletal: pain along right greater troch. Leans to left, levoscoliosis evident.  Has pain with palpation over the lower lumbar spine and along the belt line. ? ? ?   ?Assessment & Plan:  ?d1. Chronic low back pain with substantial spondylosis and scoliosis by MRI. Symptoms on examination today are most c/w facet arthropathy and SI joint dysfunction. Symptoms are most severe in the right low back and pelvis.  ?2. Fibromyalgia ?3. Chronic cervicalgia ?4. Right shoulder pain ?5. Dry eye syndrome ?6. Thoracic spine pain after fall, hx of osteoporosis.  ?7. Right hip trochanteric bursitis ?  ?  ?Plan:  ?1. Continue gabapentin for her FMS and to assist with her back pain/radiculopathy ?2.  Reviewed most recent MRI findings again today. Not one specific area is causing her pain.  She has multiple areas of note on the MRI which include her lumbar disks some of the neuroforamina as well as her facets.  Her right hip pain could be facet mediated although pain still is in the area of her greater trochanter once again.  Her scoliosis as well as her maladaptive posture does not help matters.  She also gained weight and has not been as proactive with her exercise and stretching program. ?3.  We will set patient up for greater trochanter ?     ?4. Refilled norco 7.5 #90.  ?-We will continue the controlled substance monitoring program, this consists of regular clinic visits, examinations, routine drug screening, pill counts as well as use of Controlled Substance Reporting System. [place].   ?Medication was refilled and a second prescription was sent to the patient's pharmacy for next month.   ? ?Fifteen minutes of face to face patient care time were spent during this visit. All questions were encouraged and answered.  Follow up  with me in about a month.  ? ?

## 2021-08-03 ENCOUNTER — Other Ambulatory Visit: Payer: Self-pay | Admitting: Registered Nurse

## 2021-08-13 ENCOUNTER — Telehealth: Payer: Self-pay | Admitting: Registered Nurse

## 2021-08-13 MED ORDER — HYDROCODONE-ACETAMINOPHEN 7.5-325 MG PO TABS: ORAL_TABLET | ORAL | 0 refills | Status: DC

## 2021-08-13 NOTE — Telephone Encounter (Signed)
Mr. Castor called asking for refill for Mrs. Weatherholtz.  PMP was Reviewed,  Hydrocodone e-scribed today, he verbalizes understanding.

## 2021-08-19 ENCOUNTER — Ambulatory Visit: Payer: Medicare Other | Admitting: Registered Nurse

## 2021-08-26 ENCOUNTER — Encounter: Payer: Self-pay | Admitting: Physical Medicine & Rehabilitation

## 2021-08-26 ENCOUNTER — Encounter: Payer: Medicare Other | Attending: Physical Medicine & Rehabilitation | Admitting: Physical Medicine & Rehabilitation

## 2021-08-26 VITALS — BP 99/61 | HR 93 | Ht 60.0 in | Wt 204.0 lb

## 2021-08-26 DIAGNOSIS — Z5181 Encounter for therapeutic drug level monitoring: Secondary | ICD-10-CM | POA: Diagnosis present

## 2021-08-26 DIAGNOSIS — G894 Chronic pain syndrome: Secondary | ICD-10-CM | POA: Insufficient documentation

## 2021-08-26 DIAGNOSIS — Z79891 Long term (current) use of opiate analgesic: Secondary | ICD-10-CM | POA: Insufficient documentation

## 2021-08-26 DIAGNOSIS — M7062 Trochanteric bursitis, left hip: Secondary | ICD-10-CM | POA: Diagnosis present

## 2021-08-26 DIAGNOSIS — M7061 Trochanteric bursitis, right hip: Secondary | ICD-10-CM | POA: Insufficient documentation

## 2021-08-26 NOTE — Progress Notes (Signed)
PROCEDURE NOTE  DIAGNOSIS: trochanteric bursitis right  INTERVENTION:  major joint     After informed consent and preparation of the skin with betadine and isopropyl alcohol, I injected 6mg  (1cc) of celestone and 4cc of 1% lidocaine around the right greater troch via lateral approach. Additionally, aspiration was performed prior to injection. The patient tolerated well, and no complications were encountered. Afterward the area was cleaned and dressed. Post- injection instructions were provided.      , MD, [facility] 08/26/2021

## 2021-08-26 NOTE — Patient Instructions (Signed)
PLEASE FEEL FREE TO CALL OUR OFFICE WITH ANY PROBLEMS OR QUESTIONS ([phone])      

## 2021-09-01 LAB — TOXASSURE SELECT,+ANTIDEPR,UR

## 2021-09-08 ENCOUNTER — Telehealth: Payer: Self-pay | Admitting: *Deleted

## 2021-09-08 NOTE — Telephone Encounter (Signed)
Urine drug screen for this encounter is consistent for prescribed medication 

## 2021-10-12 ENCOUNTER — Telehealth: Payer: Self-pay

## 2021-10-12 ENCOUNTER — Other Ambulatory Visit: Payer: Self-pay | Admitting: Registered Nurse

## 2021-10-12 NOTE — Telephone Encounter (Signed)
PMP was Reviewed Hydrocodone e-scribed today  Leah Barton is aware via My-Chart message.

## 2021-10-12 NOTE — Telephone Encounter (Signed)
Leah Barton called: Her August refill is not a the pharmacy. Please advise or send. (My PMP is not working).

## 2021-10-13 NOTE — Telephone Encounter (Signed)
Rx to be sent on time. Patient informed.

## 2021-11-10 ENCOUNTER — Encounter: Payer: Medicare Other | Attending: Physical Medicine & Rehabilitation | Admitting: Registered Nurse

## 2021-11-10 ENCOUNTER — Encounter: Payer: Self-pay | Admitting: Registered Nurse

## 2021-11-10 VITALS — BP 113/69 | HR 86 | Ht 60.0 in | Wt 204.0 lb

## 2021-11-10 DIAGNOSIS — M797 Fibromyalgia: Secondary | ICD-10-CM | POA: Diagnosis present

## 2021-11-10 DIAGNOSIS — M7061 Trochanteric bursitis, right hip: Secondary | ICD-10-CM | POA: Diagnosis not present

## 2021-11-10 DIAGNOSIS — M25551 Pain in right hip: Secondary | ICD-10-CM | POA: Diagnosis not present

## 2021-11-10 DIAGNOSIS — Z5181 Encounter for therapeutic drug level monitoring: Secondary | ICD-10-CM | POA: Diagnosis present

## 2021-11-10 DIAGNOSIS — G8929 Other chronic pain: Secondary | ICD-10-CM | POA: Diagnosis present

## 2021-11-10 DIAGNOSIS — Z79891 Long term (current) use of opiate analgesic: Secondary | ICD-10-CM | POA: Insufficient documentation

## 2021-11-10 DIAGNOSIS — G894 Chronic pain syndrome: Secondary | ICD-10-CM | POA: Insufficient documentation

## 2021-11-10 DIAGNOSIS — M5416 Radiculopathy, lumbar region: Secondary | ICD-10-CM | POA: Diagnosis not present

## 2021-11-10 DIAGNOSIS — M25562 Pain in left knee: Secondary | ICD-10-CM | POA: Diagnosis present

## 2021-11-10 MED ORDER — HYDROCODONE-ACETAMINOPHEN 7.5-325 MG PO TABS: ORAL_TABLET | ORAL | 0 refills | Status: DC

## 2021-11-10 NOTE — Progress Notes (Signed)
Subjective:    Patient ID: Leah Barton, female    DOB: 06-08-1950, 71 y.o.   MRN: [id number]  HPI: Leah Barton is a 71 y.o. female who returns for follow up appointment for chronic pain and medication refill. She states her pain is located in her lower back radiating into her bilateral hips R>L , she also reports right hip pain. Xray ordered, she verbalizes understanding. She had right hip injection on 08/26/2021, with relief noted she states. She also reports left knee pain. She rates her pain 7. Her current exercise regime is walking and performing stretching exercises.  Ms. Stege Morphine equivalent is 22.50 MME. She is also prescribed Clonazepam  by Dr. [name] .We have discussed the black box warning of using opioids and benzodiazepines. I highlighted the dangers of using these drugs together and discussed the adverse events including respiratory suppression, overdose, cognitive impairment and importance of compliance with current regimen. We will continue to monitor and adjust as indicated.   Ms. Riege last UDS was on 08/26/2021, it was consistent.       Pain Inventory Average Pain 5 Pain Right Now 7 My pain is intermittent, sharp, burning, stabbing, tingling, and aching  In the last 24 hours, has pain interfered with the following? General activity 4 Relation with others 3 Enjoyment of life 3 What TIME of day is your pain at its worst? varies Sleep (in general) Fair  Pain is worse with: standing Pain improves with: pacing activities Relief from Meds: 4  History reviewed. No pertinent family history. Social History   Socioeconomic History   Marital status: Married    Spouse name: Not on file   Number of children: Not on file   Years of education: Not on file   Highest education level: Not on file  Occupational History   Not on file  Tobacco Use   Smoking status: Never   Smokeless tobacco: Never  Vaping Use   Vaping Use: Never used  Substance and Sexual Activity    Alcohol use: No   Drug use: No   Sexual activity: Not on file  Other Topics Concern   Not on file  Social History Narrative   Not on file   Social Determinants of Health   Financial Resource Strain: Not on file  Food Insecurity: Not on file  Transportation Needs: Not on file  Physical Activity: Not on file  Stress: Not on file  Social Connections: Not on file   Past Surgical History:  Procedure Laterality Date   ANKLE SURGERY     ASPIRATION OF ABSCESS     CHOLECYSTECTOMY     DE QUERVAIN'S RELEASE     grastricbypas     HERNIA REPAIR     NASAL SEPTUM SURGERY     TOTAL VAGINAL HYSTERECTOMY     TUBAL LIGATION     Past Surgical History:  Procedure Laterality Date   ANKLE SURGERY     ASPIRATION OF ABSCESS     CHOLECYSTECTOMY     DE QUERVAIN'S RELEASE     grastricbypas     HERNIA REPAIR     NASAL SEPTUM SURGERY     TOTAL VAGINAL HYSTERECTOMY     TUBAL LIGATION     History reviewed. No pertinent past medical history. Ht 5' (1.524 m)   Wt 204 lb (92.5 kg)   BMI 39.84 kg/m   Opioid Risk Score:   Fall Risk Score:  `1  Depression screen [facility] 2/9     11/10/2021  2:02 PM 07/22/2021    1:43 PM 05/25/2021   12:56 PM 01/26/2021    1:26 PM 12/02/2020    1:45 PM 07/21/2020    1:41 PM 05/15/2020    1:02 PM  Depression screen PHQ 2/9  Decreased Interest 1 1 0 1 1 1 1   Down, Depressed, Hopeless 1 1 0 1 1 1 1   PHQ - 2 Score 2 2 0 2 2 2 2     Review of Systems  Musculoskeletal:  Positive for back pain.       Left knee  pain Right hip pain Right shoulder pain  All other systems reviewed and are negative.      Objective:   Physical Exam Vitals and nursing note reviewed.  Constitutional:      Appearance: Normal appearance.  Cardiovascular:     Rate and Rhythm: Normal rate and regular rhythm.     Pulses: Normal pulses.     Heart sounds: Normal heart sounds.  Pulmonary:     Effort: Pulmonary effort is normal.     Breath sounds: Normal breath sounds.   Musculoskeletal:     Cervical back: Normal range of motion and neck supple.     Comments: Normal Muscle Bulk and Muscle Testing Reveals:  Upper Extremities: Full ROM and Muscle Strength 5/5 Lumbar Paraspinal Tenderness: L-4-L-5  Right Greater Trochanter Tenderness Lower Extremities: Full ROM and Muscle Strength 5/5 Arises from Chair slowly using cane for support Antalgic Gait     Skin:    General: Skin is warm and dry.  Neurological:     Mental Status: She is alert and oriented to person, place, and time.  Psychiatric:        Mood and Affect: Mood normal.        Behavior: Behavior normal.         Assessment & Plan:  1. Chronic low back pain with substantial spondylosis and scoliosis by MRI per Dr. note. 11/10/2021. Refilled: Hydrocodone 7.5/325 mg one tablet every 8 hours as needed #90. Second script sent for the following month.  We will continue the opioid monitoring program, this consists of regular clinic visits, examinations, urine drug screen, pill counts as well as use of [place] Controlled Substance Reporting system. A 12 month History has been reviewed on the [place] Controlled Substance Reporting System on 11/10/2021. 2. Lumbar Facet Arthropathy/ SI Joint Dysfunction: Continue with Facet Stretches: S/P  MBB on 07/12/2017. 11/10/2021.. 3. Lumbar Radiculitis: Continue Gabapentin: Continue HEP as Tolerated. Continue to Monitor. 08/29//2023 4. Fibromyalgia: Continue Gabapentin and HEP as Tolerated. 11/10/2021. 5. Bilateral Greater Trochanter Bursitis R>L: S/P right major joint injection with good relief . Continue to Alternate with Ice and Heat Therapy. Continue to monitor. 11/10/2021 6. Right Hip Pain: Has increased in intensity she reports: X- Ray ordered. Awaiting Results. Continue to Monitor.11/10/2021    F/U in 2 Months

## 2022-01-06 ENCOUNTER — Encounter: Payer: Medicare Other | Attending: Physical Medicine & Rehabilitation | Admitting: Registered Nurse

## 2022-01-06 ENCOUNTER — Encounter: Payer: Self-pay | Admitting: Registered Nurse

## 2022-01-06 VITALS — BP 137/81 | HR 86 | Ht 60.0 in | Wt 200.0 lb

## 2022-01-06 DIAGNOSIS — Z79891 Long term (current) use of opiate analgesic: Secondary | ICD-10-CM | POA: Insufficient documentation

## 2022-01-06 DIAGNOSIS — M797 Fibromyalgia: Secondary | ICD-10-CM | POA: Diagnosis present

## 2022-01-06 DIAGNOSIS — M7061 Trochanteric bursitis, right hip: Secondary | ICD-10-CM | POA: Diagnosis present

## 2022-01-06 DIAGNOSIS — M5416 Radiculopathy, lumbar region: Secondary | ICD-10-CM | POA: Diagnosis present

## 2022-01-06 DIAGNOSIS — M48061 Spinal stenosis, lumbar region without neurogenic claudication: Secondary | ICD-10-CM | POA: Diagnosis present

## 2022-01-06 DIAGNOSIS — Z5181 Encounter for therapeutic drug level monitoring: Secondary | ICD-10-CM | POA: Diagnosis present

## 2022-01-06 DIAGNOSIS — G894 Chronic pain syndrome: Secondary | ICD-10-CM | POA: Diagnosis present

## 2022-01-06 NOTE — Progress Notes (Signed)
Subjective:    Patient ID: Leah Barton, female    DOB: March 01, 1951, 71 y.o.   MRN: [id number]  HPI: Leah Barton is a 71 y.o. female who returns for follow up appointment for chronic pain and medication refill. She states her pain is located in her lower back and right hip . She rates her pain 6. Her current exercise regime is walking and performing stretching exercises.  [name] reporting to Leah Barton CMA she had suicidal ideation, this provider spoke with [name] regarding her suicidal ideation. She states she had suicidal ideation three weeks ago with no plan. She states she recently had had two friends who passed away, and last week a good friend lost her husband. She admits with being depressed with her health and reports she is in constant pain. She didn't call office to discuss her constant pain, she was encouraged to call office with any questions or concerns, she verbalizes understanding.  Today, she has denied suicidal ideation or plan. We discussed counseling, she states she will look for a counselor. She had a counselor in the past, she reports. With [name] permission, her husband Mr Leah Barton was brought in the room and the above was discussed with him, he will provide emotional support for Mrs. Leah Barton and will assist in locating a counselor for Mrs. Leah Barton. Mrs. Leah Barton states again to this provider and Mr. Leah Barton she doesn't have any suicidal ideation or plan.   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 Emotional support given.   Mrs. Leah Barton is adamant she doesn't have any suicidal ideation or plan today.   We discussed her pain, she was instructed to keep a pain journal, and to send a My-Chart message on Friday Morning, she verbalizes understanding. No prescription given today, she verbalizes understanding.   Mr. Leah Barton Morphine equivalent is 22.50 MME.   UDS ordered    Pain Inventory Average Pain 5 Pain Right Now  6 My pain is  varies  In the last 24 hours, has pain interfered with the following? General activity 4 Relation with others 3 Enjoyment of life 3 What TIME of day is your pain at its worst? varies Sleep (in general) Fair  Pain is worse with: unsure Pain improves with: rest, heat/ice, pacing activities, medication, and injections Relief from Meds: 5  No family history on file. Social History   Socioeconomic History   Marital status: Married    Spouse name: Not on file   Number of children: Not on file   Years of education: Not on file   Highest education level: Not on file  Occupational History   Not on file  Tobacco Use   Smoking status: Never   Smokeless tobacco: Never  Vaping Use   Vaping Use: Never used  Substance and Sexual Activity   Alcohol use: No   Drug use: No   Sexual activity: Not on file  Other Topics Concern   Not on file  Social History Narrative   Not on file   Social Determinants of Health   Financial Resource Strain: Not on file  Food Insecurity: Not on file  Transportation Needs: Not on file  Physical Activity: Not on file  Stress: Not on file  Social Connections: Not on file   Past Surgical History:  Procedure Laterality Date   Ducor  RELEASE     grastricbypas     HERNIA REPAIR     NASAL SEPTUM SURGERY     TOTAL VAGINAL HYSTERECTOMY     TUBAL LIGATION     Past Surgical History:  Procedure Laterality Date   ANKLE SURGERY     ASPIRATION OF ABSCESS     CHOLECYSTECTOMY     DE QUERVAIN'S RELEASE     grastricbypas     HERNIA REPAIR     NASAL SEPTUM SURGERY     TOTAL VAGINAL HYSTERECTOMY     TUBAL LIGATION     No past medical history on file. There were no vitals taken for this visit.  Opioid Risk Score:   Fall Risk Score:  `1  Depression screen PHQ 2/9     11/10/2021    2:02 PM 07/22/2021    1:43 PM 05/25/2021   12:56 PM 01/26/2021    1:26 PM 12/02/2020     1:45 PM 07/21/2020    1:41 PM 05/15/2020    1:02 PM  Depression screen PHQ 2/9  Decreased Interest 1 1 0 1 1 1 1   Down, Depressed, Hopeless 1 1 0 1 1 1 1   PHQ - 2 Score 2 2 0 2 2 2 2       Review of Systems  Musculoskeletal:  Positive for back pain.       Left knee pain Right hip pain   All other systems reviewed and are negative.     Objective:   Physical Exam Vitals and nursing note reviewed.  Constitutional:      Appearance: Normal appearance.  Cardiovascular:     Rate and Rhythm: Normal rate and regular rhythm.     Pulses: Normal pulses.     Heart sounds: Normal heart sounds.  Pulmonary:     Effort: Pulmonary effort is normal.     Breath sounds: Normal breath sounds.  Musculoskeletal:     Cervical back: Normal range of motion and neck supple.     Comments: Normal Muscle Bulk and Muscle Testing Reveals:  Upper Extremities: Full ROM and Muscle Strength 5/5  Lumbar Paraspinal Tenderness: L-3-L-5 Right Greater Trochanter Tenderness Lower Extremities: Full ROM and Muscle Strength 5/5 Arises from Chair slowly using cane for support Antalgic  Gait     Skin:    General: Skin is warm and dry.  Neurological:     Mental Status: She is alert and oriented to person, place, and time.  Psychiatric:        Mood and Affect: Mood normal.        Behavior: Behavior normal.         Assessment & Plan:  1. Chronic low back pain with substantial spondylosis and scoliosis by MRI per Dr. Naaman Plummer note. 01/06/2022. Continue: Hydrocodone 7.5/325 mg one tablet every 8 hours as needed #90. No script given. She was encouraged to keep a Pain Journal and to call office or send My-Chart message  on Friday morning. She verbalizes understanding.  We will continue the opioid monitoring program, this consists of regular clinic visits, examinations, urine drug screen, pill counts as well as use of [place] Controlled Substance Reporting system. A 12 month History has been reviewed on the [place] on 01/06/2022. 2. Lumbar Facet Arthropathy/ SI Joint Dysfunction: Continue with Facet Stretches: S/P  MBB on 07/12/2017. 01/06/2022.. 3. Lumbar Radiculitis: Continue Gabapentin: Continue HEP as Tolerated. Continue to Monitor. 010/25//2023 4. Fibromyalgia: Continue Gabapentin and HEP as Tolerated. 01/06/2022.  5. Bilateral Greater Trochanter Bursitis R>L: S/P right major joint injection with good relief .  Continue to Alternate with Ice and Heat Therapy. Continue to monitor. 11/10/2021 6. Right Hip Pain: [name] states she will have hip X-ray tomorrow, she reports she has increased  intensity of right hip pain : X- Ray ordered. Awaiting Results. Continue to Monitor.01/06/2022  F/U in 2 months

## 2022-01-08 ENCOUNTER — Telehealth: Payer: Self-pay | Admitting: Registered Nurse

## 2022-01-08 MED ORDER — HYDROCODONE-ACETAMINOPHEN 7.5-325 MG PO TABS: 1.0000 | ORAL_TABLET | Freq: Four times a day (QID) | ORAL | 0 refills | Status: DC | PRN

## 2022-01-08 NOTE — Telephone Encounter (Signed)
Return Ms. Comins call,she reporting 2- 3 hours of relief with her current medication regimen. We will increase her Hydrocodone to 4 times a day as needed for pain, she verbalizes understanding. She was instructed to send a My- Chart message in two weeks with update on medication change, she verbalizes understanding.

## 2022-01-12 LAB — TOXASSURE SELECT,+ANTIDEPR,UR

## 2022-01-13 ENCOUNTER — Telehealth: Payer: Self-pay | Admitting: *Deleted

## 2022-01-13 NOTE — Telephone Encounter (Signed)
Urine drug screen for this encounter is consistent for prescribed medication 

## 2022-02-02 ENCOUNTER — Other Ambulatory Visit: Payer: Self-pay | Admitting: Physical Medicine & Rehabilitation

## 2022-02-03 MED ORDER — GABAPENTIN 300 MG PO CAPS: 300.0000 mg | ORAL_CAPSULE | Freq: Two times a day (BID) | ORAL | 2 refills | Status: DC

## 2022-02-03 NOTE — Addendum Note (Signed)
Addended by: Doreene Eland on: 02/03/2022 12:04 PM   Modules accepted: Orders

## 2022-02-03 NOTE — Telephone Encounter (Signed)
PMP was Reviewed.  Gabapentin e-scribed

## 2022-02-03 NOTE — Addendum Note (Signed)
Addended by: Jones Bales on: 02/03/2022 12:57 PM   Modules accepted: Orders

## 2022-02-08 ENCOUNTER — Other Ambulatory Visit: Payer: Self-pay | Admitting: Registered Nurse

## 2022-02-09 NOTE — Telephone Encounter (Signed)
PMP not working for [employer] last Refill on 01/12/2022 Hydrocodone e-scribed today.  Ms. Ingram is aware of the above via MY-Chart

## 2022-02-09 NOTE — Telephone Encounter (Signed)
Leah Barton called about getting her refill this morning. The pharmacy is requesting it as well.

## 2022-03-03 ENCOUNTER — Encounter: Payer: Medicare Other | Attending: Physical Medicine & Rehabilitation | Admitting: Registered Nurse

## 2022-03-03 ENCOUNTER — Encounter: Payer: Self-pay | Admitting: Registered Nurse

## 2022-03-03 VITALS — BP 112/70 | HR 88 | Ht 60.0 in | Wt 201.0 lb

## 2022-03-03 DIAGNOSIS — M48061 Spinal stenosis, lumbar region without neurogenic claudication: Secondary | ICD-10-CM | POA: Diagnosis present

## 2022-03-03 DIAGNOSIS — M797 Fibromyalgia: Secondary | ICD-10-CM

## 2022-03-03 DIAGNOSIS — Z5181 Encounter for therapeutic drug level monitoring: Secondary | ICD-10-CM | POA: Diagnosis present

## 2022-03-03 DIAGNOSIS — Z79891 Long term (current) use of opiate analgesic: Secondary | ICD-10-CM

## 2022-03-03 DIAGNOSIS — M7061 Trochanteric bursitis, right hip: Secondary | ICD-10-CM | POA: Diagnosis present

## 2022-03-03 DIAGNOSIS — G894 Chronic pain syndrome: Secondary | ICD-10-CM

## 2022-03-03 DIAGNOSIS — M5416 Radiculopathy, lumbar region: Secondary | ICD-10-CM | POA: Diagnosis present

## 2022-03-03 MED ORDER — HYDROCODONE-ACETAMINOPHEN 7.5-325 MG PO TABS: 1.0000 | ORAL_TABLET | Freq: Four times a day (QID) | ORAL | 0 refills | Status: DC | PRN

## 2022-03-03 NOTE — Progress Notes (Deleted)
   Subjective:    Patient ID: Leah Barton, female    DOB: 1950-10-14, 71 y.o.   MRN: [id number]  HPI   .cpr Review of Systems     Objective:   Physical Exam        Assessment & Plan:

## 2022-03-03 NOTE — Progress Notes (Deleted)
   Subjective:    Patient ID: Leah Barton, female    DOB: 07/26/1950, 71 y.o.   MRN: [id number]  HPI   .cpr Review of Systems     Objective:   Physical Exam        Assessment & Plan:    

## 2022-03-03 NOTE — Progress Notes (Signed)
Subjective:    Patient ID: Leah Barton, female    DOB: 03/05/1951, 71 y.o.   MRN: [id number]  HPI: Leah Barton is a 71 y.o. female who returns for follow up appointment for chronic pain and medication refill. She states her pain is located in her lower back radiating into her right hip. She rates her pain 6. Her current exercise regime is walking short distances and performing stretching exercises.  Leah Barton reports she was in hospital on Thanksgiving at [facility] in [place].   Leah Barton Morphine equivalent is 30.00 MME. She is also prescribed Clonazepam by Dr. [name] .We have discussed the black box warning of using opioids and benzodiazepines. I highlighted the dangers of using these drugs together and discussed the adverse events including respiratory suppression, overdose, cognitive impairment and importance of compliance with current regimen. We will continue to monitor and adjust as indicated.   Last UDS was Performed on 01/06/2022, it was consistent.      Pain Inventory Average Pain 6 Pain Right Now 6 My pain is constant, sharp, burning, dull, stabbing, tingling, and aching  In the last 24 hours, has pain interfered with the following? General activity 6 Relation with others 6 Enjoyment of life 6 What TIME of day is your pain at its worst? varies Sleep (in general) Fair  Pain is worse with: walking, bending, sitting, inactivity, standing, and some activites Pain improves with: rest, heat/ice, and medication Relief from Meds: 8  No family history on file. Social History   Socioeconomic History   Marital status: Married    Spouse name: Not on file   Number of children: Not on file   Years of education: Not on file   Highest education level: Not on file  Occupational History   Not on file  Tobacco Use   Smoking status: Never   Smokeless tobacco: Never  Vaping Use   Vaping Use: Never used  Substance and Sexual Activity   Alcohol use: No   Drug use: No    Sexual activity: Not on file  Other Topics Concern   Not on file  Social History Narrative   Not on file   Social Determinants of Health   Financial Resource Strain: Not on file  Food Insecurity: Not on file  Transportation Needs: Not on file  Physical Activity: Not on file  Stress: Not on file  Social Connections: Not on file   Past Surgical History:  Procedure Laterality Date   ANKLE SURGERY     ASPIRATION OF ABSCESS     CHOLECYSTECTOMY     DE QUERVAIN'S RELEASE     grastricbypas     HERNIA REPAIR     NASAL SEPTUM SURGERY     TOTAL VAGINAL HYSTERECTOMY     TUBAL LIGATION     Past Surgical History:  Procedure Laterality Date   ANKLE SURGERY     ASPIRATION OF ABSCESS     CHOLECYSTECTOMY     DE QUERVAIN'S RELEASE     grastricbypas     HERNIA REPAIR     NASAL SEPTUM SURGERY     TOTAL VAGINAL HYSTERECTOMY     TUBAL LIGATION     No past medical history on file. There were no vitals taken for this visit.  Opioid Risk Score:   Fall Risk Score:  `1  Depression screen [facility] 2/9     01/06/2022    1:37 PM 11/10/2021    2:02 PM 07/22/2021    1:43 PM 05/25/2021  12:56 PM 01/26/2021    1:26 PM 12/02/2020    1:45 PM 07/21/2020    1:41 PM  Depression screen PHQ 2/9  Decreased Interest 1 1 1  0 1 1 1   Down, Depressed, Hopeless 1 1 1  0 1 1 1   PHQ - 2 Score 2 2 2  0 2 2 2   Suicidal thoughts 1          Review of Systems  Musculoskeletal:  Positive for back pain and gait problem.       Right arm pain, right elbow pain, pain in both knees  All other systems reviewed and are negative.      Objective:   Physical Exam Vitals and nursing note reviewed.  Constitutional:      Appearance: Normal appearance.  Cardiovascular:     Rate and Rhythm: Normal rate. Rhythm irregular.     Pulses: Normal pulses.     Heart sounds: Normal heart sounds.  Pulmonary:     Effort: Pulmonary effort is normal.     Breath sounds: Normal breath sounds.  Musculoskeletal:     Cervical back:  Normal range of motion and neck supple.     Comments: Normal Muscle Bulk and Muscle Testing Reveals:  Upper Extremities: Full ROM and Muscle Strength 5/5 , Lumbar Paraspinal Tenderness: L-3-L-5 Bilateral Greater Trochanter Tenderness: R>L Lower Extremities: Full ROM and Muscle Strength 5/5 Arises from Table slowly Antalgic  Gait     Skin:    General: Skin is warm and dry.  Neurological:     Mental Status: She is alert and oriented to person, place, and time.  Psychiatric:        Mood and Affect: Mood normal.        Behavior: Behavior normal.         Assessment & Plan:  1. Chronic low back pain with substantial spondylosis and scoliosis by MRI per Dr. note. 03/03/2022. Continue: Hydrocodone 7.5/325 mg one tablet every 8 hours as needed #90. Second script sent for the following month. We will continue the opioid monitoring program, this consists of regular clinic visits, examinations, urine drug screen, pill counts as well as use of Controlled Substance Reporting system. A 12 month History has been reviewed on the Controlled Substance Reporting System on 03/03/2022. 2. Lumbar Facet Arthropathy/ SI Joint Dysfunction: Continue with Facet Stretches: S/P  MBB on 07/12/2017. 03/03/2022.. 3. Lumbar Radiculitis: Continue Gabapentin: Continue HEP as Tolerated. Continue to Monitor. 12/20//2023 4. Fibromyalgia: Continue Gabapentin and HEP as Tolerated. 03/03/2022. 5. Bilateral Greater Trochanter Bursitis R>L: S/P right major joint injection with good relief .  Continue to Alternate with Ice and Heat Therapy. Continue to monitor. 03/03/2022 6. Right Hip Pain: Continue HEP as tolerated.  Continue to Monitor.03/03/2022   F/U in 2 months

## 2022-03-12 ENCOUNTER — Encounter: Payer: Self-pay | Admitting: Registered Nurse

## 2022-05-04 ENCOUNTER — Telehealth: Payer: Self-pay | Admitting: Physical Medicine & Rehabilitation

## 2022-05-04 MED ORDER — HYDROCODONE-ACETAMINOPHEN 7.5-325 MG PO TABS: 1.0000 | ORAL_TABLET | Freq: Four times a day (QID) | ORAL | 0 refills | Status: DC | PRN

## 2022-05-04 NOTE — Telephone Encounter (Signed)
We had to reschedule patient with Zella Ball, she is out all week.  Patient will need refill on medication.

## 2022-05-04 NOTE — Telephone Encounter (Signed)
Hydrocodone refilled

## 2022-05-05 ENCOUNTER — Encounter: Payer: Medicare Other | Admitting: Registered Nurse

## 2022-06-01 ENCOUNTER — Encounter: Payer: Self-pay | Admitting: Registered Nurse

## 2022-06-01 ENCOUNTER — Encounter: Payer: Medicare Other | Attending: Physical Medicine & Rehabilitation | Admitting: Registered Nurse

## 2022-06-01 VITALS — BP 132/77 | HR 88 | Ht 60.0 in | Wt 200.2 lb

## 2022-06-01 DIAGNOSIS — G894 Chronic pain syndrome: Secondary | ICD-10-CM

## 2022-06-01 DIAGNOSIS — M545 Low back pain, unspecified: Secondary | ICD-10-CM

## 2022-06-01 DIAGNOSIS — M797 Fibromyalgia: Secondary | ICD-10-CM | POA: Diagnosis present

## 2022-06-01 DIAGNOSIS — M25561 Pain in right knee: Secondary | ICD-10-CM | POA: Diagnosis present

## 2022-06-01 DIAGNOSIS — Z79891 Long term (current) use of opiate analgesic: Secondary | ICD-10-CM | POA: Diagnosis not present

## 2022-06-01 DIAGNOSIS — M25562 Pain in left knee: Secondary | ICD-10-CM

## 2022-06-01 DIAGNOSIS — Z5181 Encounter for therapeutic drug level monitoring: Secondary | ICD-10-CM | POA: Diagnosis not present

## 2022-06-01 DIAGNOSIS — M7061 Trochanteric bursitis, right hip: Secondary | ICD-10-CM | POA: Diagnosis present

## 2022-06-01 DIAGNOSIS — M25551 Pain in right hip: Secondary | ICD-10-CM

## 2022-06-01 DIAGNOSIS — G8929 Other chronic pain: Secondary | ICD-10-CM | POA: Diagnosis present

## 2022-06-01 DIAGNOSIS — M5416 Radiculopathy, lumbar region: Secondary | ICD-10-CM

## 2022-06-01 MED ORDER — HYDROCODONE-ACETAMINOPHEN 7.5-325 MG PO TABS: 1.0000 | ORAL_TABLET | Freq: Four times a day (QID) | ORAL | 0 refills | Status: DC | PRN

## 2022-06-01 MED ORDER — METHYLPREDNISOLONE 4 MG PO TBPK: ORAL_TABLET | ORAL | 0 refills | Status: DC

## 2022-06-01 NOTE — Progress Notes (Signed)
Subjective:    Patient ID: Leah Barton, female    DOB: 08/04/1950, 72 y.o.   MRN: [id number]  HPI: Leah Barton is a 72 y.o. female who returns for follow up appointment for chronic pain and medication refill. She states her pain is located in her lower back radiating into her right hip and left knee pain. She also reports her lower back pain has increased in intensity, she denies falling. Will obtain her X-ray results from [facility], she verbalizes understanding. She rates her pain 8.Her  current exercise regime is walking and performing stretching exercises.   [name] Morphine equivalent is 30.00 MME. She is also prescribed Clonazepam  by Dr. Shon Millet .We have discussed the black box warning of using opioids and benzodiazepines. I highlighted the dangers of using these drugs together and discussed the adverse events including respiratory suppression, overdose, cognitive impairment and importance of compliance with current regimen. We will continue to monitor and adjust as indicated.    Oral Swab was Performed today.     Pain Inventory Average Pain 5 Pain Right Now 8 My pain is constant, sharp, burning, dull, stabbing, tingling, and aching  In the last 24 hours, has pain interfered with the following? General activity 8 Relation with others 8 Enjoyment of life 8 What TIME of day is your pain at its worst? morning , daytime, evening, and night Sleep (in general) Poor  Pain is worse with: unsure Pain improves with: medication Relief from Meds: 4  No family history on file. Social History   Socioeconomic History   Marital status: Married    Spouse name: Not on file   Number of children: Not on file   Years of education: Not on file   Highest education level: Not on file  Occupational History   Not on file  Tobacco Use   Smoking status: Never   Smokeless tobacco: Never  Vaping Use   Vaping Use: Never used  Substance and Sexual Activity   Alcohol use: No   Drug use: No    Sexual activity: Not on file  Other Topics Concern   Not on file  Social History Narrative   Not on file   Social Determinants of Health   Financial Resource Strain: Not on file  Food Insecurity: Not on file  Transportation Needs: Not on file  Physical Activity: Not on file  Stress: Not on file  Social Connections: Not on file   Past Surgical History:  Procedure Laterality Date   ANKLE [place]     grastricbypas     HERNIA REPAIR     NASAL SEPTUM SURGERY     TOTAL VAGINAL HYSTERECTOMY     TUBAL LIGATION     Past Surgical History:  Procedure Laterality Date   ANKLE SURGERY     ASPIRATION OF ABSCESS     CHOLECYSTECTOMY     DE [facility]     No past medical history on file. BP 132/77   Pulse 88   Ht 5' (1.524 m)   Wt 200 lb 3.2 oz (90.8 kg)   SpO2 94%   BMI 39.10 kg/m   Opioid Risk Score:   Fall Risk Score:  `1  Depression  screen PHQ 2/9     06/01/2022    2:18 PM 03/03/2022    1:13 PM 01/06/2022    1:37 PM 11/10/2021    2:02 PM 07/22/2021    1:43 PM 05/25/2021   12:56 PM 01/26/2021    1:26 PM  Depression screen PHQ 2/9  Decreased Interest 0 0 1 1 1  0 1  Down, Depressed, Hopeless 0 0 1 1 1  0 1  PHQ - 2 Score 0 0 2 2 2  0 2  Suicidal thoughts   1         Review of Systems  Constitutional: Negative.   HENT: Negative.    Eyes: Negative.   Respiratory: Negative.    Cardiovascular: Negative.   Gastrointestinal: Negative.   Endocrine: Negative.   Genitourinary: Negative.   Musculoskeletal:  Positive for arthralgias, back pain, gait problem and myalgias.  Skin: Negative.   Allergic/Immunologic: Negative.   Hematological: Negative.   Psychiatric/Behavioral: Negative.    All other systems reviewed and are negative.      Objective:   Physical Exam Vitals and  nursing note reviewed.  Constitutional:      Appearance: Normal appearance. She is obese.  Cardiovascular:     Rate and Rhythm: Normal rate and regular rhythm.     Pulses: Normal pulses.     Heart sounds: Normal heart sounds.  Pulmonary:     Effort: Pulmonary effort is normal.     Breath sounds: Normal breath sounds.  Musculoskeletal:     Cervical back: Normal range of motion and neck supple.     Comments: Normal Muscle Bulk and Muscle Testing Reveals:  Upper Extremities: Full ROM and Muscle Strength 5/5 Bilateral AC Joint Tenderness  Lumbar Hypersensitivity Bilateral Greater Trochanter Tenderness Lower Extremities: Full ROM and Muscle Strength 5/5 Arises from Table slowly Antalgic  Gait     Skin:    General: Skin is warm and dry.  Neurological:     Mental Status: She is alert and oriented to person, place, and time.  Psychiatric:        Mood and Affect: Mood normal.        Behavior: Behavior normal.         Assessment & Plan:  Acute Exacerbation of Chronic Low Back Pain: RX: Medrol Dose Pak. Will review Lumbar X-ray from [facility]. She verbalizes understanding. Continue to Monitor.  2. Chronic low back pain with substantial spondylosis and scoliosis by MRI per Dr. Naaman Plummer note. 06/01/2022 Continue: Hydrocodone 7.5/325 mg one tablet every 6 hours as needed #120. Second script sent for the following month. We will continue the opioid monitoring program, this consists of regular clinic visits, examinations, urine drug screen, pill counts as well as use of [place] Controlled Substance Reporting system. A 12 month History has been reviewed on the [facility] on 06/01/2022. 2. Lumbar Facet Arthropathy/ SI Joint Dysfunction: Continue with Facet Stretches: S/P  MBB on 07/12/2017. 06/01/2022.. 3. Lumbar Radiculitis: Continue Gabapentin: Continue HEP as Tolerated. Continue to Monitor. 03/19//2024 4. Fibromyalgia: Continue Gabapentin and HEP as  Tolerated. 06/01/2022. 5. Right Greater Trochanter Bursitis: S/P right major joint injection with good relief on 08/26/2021.  Continue to Alternate with Ice and Heat Therapy. Continue to monitor. 06/01/2022 6. Right Hip Pain: Continue HEP as tolerated.  Continue to Monitor.06/01/2022   F/U in 2 months

## 2022-06-04 LAB — DRUG TOX MONITOR 1 W/CONF, ORAL FLD
Amobarbital: NEGATIVE ng/mL (ref <10)
Amphetamines: NEGATIVE ng/mL (ref <10)
Barbiturates: POSITIVE ng/mL — AB (ref <10)
Benzodiazepines: NEGATIVE ng/mL (ref <0.50)
Buprenorphine: NEGATIVE ng/mL (ref <0.10)
Butalbital: NEGATIVE ng/mL (ref <10)
Cocaine: NEGATIVE ng/mL (ref <5.0)
Codeine: NEGATIVE ng/mL (ref <2.5)
Dihydrocodeine: 6.2 ng/mL — ABNORMAL HIGH (ref <2.5)
Fentanyl: NEGATIVE ng/mL (ref <0.10)
Heroin Metabolite: NEGATIVE ng/mL (ref <1.0)
Hydrocodone: 39.5 ng/mL — ABNORMAL HIGH (ref <2.5)
Hydromorphone: NEGATIVE ng/mL (ref <2.5)
MARIJUANA: NEGATIVE ng/mL (ref <2.5)
MDMA: NEGATIVE ng/mL (ref <10)
Meprobamate: NEGATIVE ng/mL (ref <2.5)
Methadone: NEGATIVE ng/mL (ref <5.0)
Morphine: NEGATIVE ng/mL (ref <2.5)
Nicotine Metabolite: NEGATIVE ng/mL (ref <5.0)
Norhydrocodone: 6.1 ng/mL — ABNORMAL HIGH (ref <2.5)
Noroxycodone: NEGATIVE ng/mL (ref <2.5)
Opiates: POSITIVE ng/mL — AB (ref <2.5)
Oxycodone: NEGATIVE ng/mL (ref <2.5)
Oxymorphone: NEGATIVE ng/mL (ref <2.5)
Pentobarbital: NEGATIVE ng/mL (ref <10)
Phencyclidine: NEGATIVE ng/mL (ref <10)
Phenobarbital: >500 ng/mL — ABNORMAL HIGH (ref <10)
Secobarbital: NEGATIVE ng/mL (ref <10)
Tapentadol: NEGATIVE ng/mL (ref <5.0)
Tramadol: NEGATIVE ng/mL (ref <5.0)
Zolpidem: NEGATIVE ng/mL (ref <5.0)

## 2022-06-04 LAB — DRUG TOX ALC METAB W/CON, ORAL FLD: Alcohol Metabolite: NEGATIVE ng/mL (ref <25)

## 2022-06-15 ENCOUNTER — Telehealth: Payer: Self-pay | Admitting: *Deleted

## 2022-06-15 NOTE — Telephone Encounter (Signed)
Oral swab drug screen was consistent for prescribed medications.  ?

## 2022-07-02 ENCOUNTER — Other Ambulatory Visit: Payer: Self-pay | Admitting: Registered Nurse

## 2022-07-02 NOTE — Telephone Encounter (Signed)
PMP was Reviewed.  Hydrocodone E-scribed today.

## 2022-07-29 ENCOUNTER — Encounter: Payer: Medicare Other | Attending: Physical Medicine & Rehabilitation | Admitting: Registered Nurse

## 2022-07-29 ENCOUNTER — Encounter: Payer: Self-pay | Admitting: Registered Nurse

## 2022-07-29 VITALS — BP 110/71 | HR 84 | Ht 60.0 in | Wt 202.0 lb

## 2022-07-29 DIAGNOSIS — Z79891 Long term (current) use of opiate analgesic: Secondary | ICD-10-CM | POA: Diagnosis present

## 2022-07-29 DIAGNOSIS — M25562 Pain in left knee: Secondary | ICD-10-CM | POA: Diagnosis present

## 2022-07-29 DIAGNOSIS — M797 Fibromyalgia: Secondary | ICD-10-CM | POA: Diagnosis present

## 2022-07-29 DIAGNOSIS — Z5181 Encounter for therapeutic drug level monitoring: Secondary | ICD-10-CM | POA: Diagnosis present

## 2022-07-29 DIAGNOSIS — G8929 Other chronic pain: Secondary | ICD-10-CM | POA: Diagnosis present

## 2022-07-29 DIAGNOSIS — G894 Chronic pain syndrome: Secondary | ICD-10-CM | POA: Insufficient documentation

## 2022-07-29 DIAGNOSIS — M25551 Pain in right hip: Secondary | ICD-10-CM | POA: Diagnosis present

## 2022-07-29 DIAGNOSIS — M5416 Radiculopathy, lumbar region: Secondary | ICD-10-CM | POA: Diagnosis present

## 2022-07-29 MED ORDER — GABAPENTIN 300 MG PO CAPS: 300.0000 mg | ORAL_CAPSULE | Freq: Two times a day (BID) | ORAL | 2 refills | Status: DC

## 2022-07-29 MED ORDER — HYDROCODONE-ACETAMINOPHEN 10-325 MG PO TABS: 1.0000 | ORAL_TABLET | Freq: Four times a day (QID) | ORAL | 0 refills | Status: DC | PRN

## 2022-07-29 NOTE — Progress Notes (Deleted)
   Subjective:    Patient ID: Leah Barton, female    DOB: March 25, 1950, 72 y.o.   MRN: [id number]  HPI   Pain Inventory Average Pain {NUMBERS; 0-10:5044} Pain Right Now {NUMBERS; 0-10:5044} My pain is {PAIN DESCRIPTION:21022940}  In the last 24 hours, has pain interfered with the following? General activity {NUMBERS; 0-10:5044} Relation with others {NUMBERS; 0-10:5044} Enjoyment of life {NUMBERS; 0-10:5044} What TIME of day is your pain at its worst? {time of day:24191} Sleep (in general) {BHH GOOD/FAIR/POOR:22877}  Pain is worse with: {ACTIVITIES:21022942} Pain improves with: {PAIN IMPROVES YQMV:[id number]} Relief from Meds: {NUMBERS; 0-10:5044}  No family history on file. Social History   Socioeconomic History   Marital status: Married    Spouse name: Not on file   Number of children: Not on file   Years of education: Not on file   Highest education level: Not on file  Occupational History   Not on file  Tobacco Use   Smoking status: Never   Smokeless tobacco: Never  Vaping Use   Vaping Use: Never used  Substance and Sexual Activity   Alcohol use: No   Drug use: No   Sexual activity: Not on file  Other Topics Concern   Not on file  Social History Narrative   Not on file   Social Determinants of Health   Financial Resource Strain: Not on file  Food Insecurity: Not on file  Transportation Needs: Not on file  Physical Activity: Not on file  Stress: Not on file  Social Connections: Not on file   Past Surgical History:  Procedure Laterality Date   ANKLE SURGERY     ASPIRATION OF ABSCESS     CHOLECYSTECTOMY     DE QUERVAIN'S RELEASE     grastricbypas     HERNIA REPAIR     NASAL SEPTUM SURGERY     TOTAL VAGINAL HYSTERECTOMY     TUBAL LIGATION     Past Surgical History:  Procedure Laterality Date   ANKLE SURGERY     ASPIRATION OF ABSCESS     CHOLECYSTECTOMY     DE QUERVAIN'S RELEASE     grastricbypas     HERNIA REPAIR     NASAL SEPTUM SURGERY      TOTAL VAGINAL HYSTERECTOMY     TUBAL LIGATION     No past medical history on file. There were no vitals taken for this visit.  Opioid Risk Score:   Fall Risk Score:  `1  Depression screen PHQ 2/9     06/01/2022    2:18 PM 03/03/2022    1:13 PM 01/06/2022    1:37 PM 11/10/2021    2:02 PM 07/22/2021    1:43 PM 05/25/2021   12:56 PM 01/26/2021    1:26 PM  Depression screen PHQ 2/9  Decreased Interest 0 0 1 1 1  0 1  Down, Depressed, Hopeless 0 0 1 1 1  0 1  PHQ - 2 Score 0 0 2 2 2  0 2  Suicidal thoughts   1        Review of Systems     Objective:   Physical Exam        Assessment & Plan:

## 2022-07-29 NOTE — Progress Notes (Signed)
Subjective:    Patient ID: Leah Barton, female    DOB: 08-15-1950, 72 y.o.   MRN: [id number]  YQM:VHQIONG Cancel is a 72 y.o. female who returns for follow up appointment for chronic pain and medication refill. She states her  pain is located in her mid- lower back radiating into her bilateral hips R>L and Left l knee pain. . She also reports increase intensity and frequency of lower back pain. She will be scheduled with Dr Wynn Banker to discuss injection. Her last injection was MBB on 07/12/2017, she verbalizes understanding.  She rates her pain 9. Her current exercise regime is walking and performing stretching exercises.  Ms. Leppek Morphine equivalent is 30.00 MME. She  is also prescribed Clonazepam  by Dr. [name] .We have discussed the black box warning of using opioids and benzodiazepines. I highlighted the dangers of using these drugs together and discussed the adverse events including respiratory suppression, overdose, cognitive impairment and importance of compliance with current regimen. We will continue to monitor and adjust as indicated.   Last Oral Swab was Performed on 06/01/2022    Pain Inventory Average Pain 6 Pain Right Now 9 My pain is  other  In the last 24 hours, has pain interfered with the following? General activity 9 Relation with others 9 Enjoyment of life 9 What TIME of day is your pain at its worst? morning , daytime, evening, and night Sleep (in general) Poor  Pain is worse with: walking, bending, and standing Pain improves with: pacing activities and medication Relief from Meds: 3  No family history on file. Social History   Socioeconomic History   Marital status: Married    Spouse name: Not on file   Number of children: Not on file   Years of education: Not on file   Highest education level: Not on file  Occupational History   Not on file  Tobacco Use   Smoking status: Never   Smokeless tobacco: Never  Vaping Use   Vaping Use: Never used   Substance and Sexual Activity   Alcohol use: No   Drug use: No   Sexual activity: Not on file  Other Topics Concern   Not on file  Social History Narrative   Not on file   Social Determinants of Health   Financial Resource Strain: Not on file  Food Insecurity: Not on file  Transportation Needs: Not on file  Physical Activity: Not on file  Stress: Not on file  Social Connections: Not on file   Past Surgical History:  Procedure Laterality Date   ANKLE SURGERY     ASPIRATION OF ABSCESS     CHOLECYSTECTOMY     DE QUERVAIN'S RELEASE     grastricbypas     HERNIA REPAIR     NASAL SEPTUM SURGERY     TOTAL VAGINAL HYSTERECTOMY     TUBAL LIGATION     Past Surgical History:  Procedure Laterality Date   ANKLE SURGERY     ASPIRATION OF ABSCESS     CHOLECYSTECTOMY     DE QUERVAIN'S RELEASE     grastricbypas     HERNIA REPAIR     NASAL SEPTUM SURGERY     TOTAL VAGINAL HYSTERECTOMY     TUBAL LIGATION     No past medical history on file. There were no vitals taken for this visit.  Opioid Risk Score:   Fall Risk Score:  `1  Depression screen [facility] 2/9     06/01/2022  2:18 PM 03/03/2022    1:13 PM 01/06/2022    1:37 PM 11/10/2021    2:02 PM 07/22/2021    1:43 PM 05/25/2021   12:56 PM 01/26/2021    1:26 PM  Depression screen PHQ 2/9  Decreased Interest 0 0 1 1 1  0 1  Down, Depressed, Hopeless 0 0 1 1 1  0 1  PHQ - 2 Score 0 0 2 2 2  0 2  Suicidal thoughts   1         Review of Systems  Musculoskeletal:  Positive for back pain.       LT knee pain RT hip pain  All other systems reviewed and are negative.     Objective:   Physical Exam Vitals and nursing note reviewed.  Constitutional:      Appearance: Normal appearance. She is obese.  Cardiovascular:     Rate and Rhythm: Normal rate and regular rhythm.     Pulses: Normal pulses.     Heart sounds: Normal heart sounds.  Musculoskeletal:     Cervical back: Normal range of motion and neck supple.     Comments:  Normal Muscle Bulk and Muscle Testing Reveals:  Upper Extremities: Full ROM and Muscle Strength 5/5 Lumbar Paraspinal Tenderness: L-3-L-5 Lower Extremities: Full ROM and Muscle Strength 5/5 Left Lower Extremity Flexion Produces Pain into her Popliteal Fossa Arises from Chair Slowly Antalgic  Gait     Skin:    General: Skin is warm and dry.  Neurological:     Mental Status: She is alert and oriented to person, place, and time.  Psychiatric:        Mood and Affect: Mood normal.        Behavior: Behavior normal.         Assessment & Plan:  1. Chronic low back pain with substantial spondylosis and scoliosis by MRI per Dr. Riley Kill note. 07/29/2022. Continue: Hydrocodone 7.5/325 mg one tablet every 8 hours as needed #90. Second script sent for the following month. We will continue the opioid monitoring program, this consists of regular clinic visits, examinations, urine drug screen, pill counts as well as use of [place] Controlled Substance Reporting system. A 12 month History has been reviewed on the [place] Controlled Substance Reporting System on 07/29/2022. 2. Lumbar Facet Arthropathy/ SI Joint Dysfunction: Continue with Facet Stretches: S/P  MBB on 07/12/2017. 07/29/2022.. 3. Lumbar Radiculitis: Continue Gabapentin: Continue HEP as Tolerated. Continue to Monitor. 05/16//2024 4. Fibromyalgia: Continue Gabapentin and HEP as Tolerated. 07/29/2022. 5. Bilateral Greater Trochanter Bursitis R>L: S/P right major joint injection with good relief .  Continue to Alternate with Ice and Heat Therapy. Continue to monitor. 07/29/2022 6. Right Hip Pain: Continue HEP as tolerated.  Continue to Monitor.07/29/2022   F/U in 2 months

## 2022-07-29 NOTE — Patient Instructions (Signed)
Call [employer] in you area to see if they can assist with taking you to your visits.    Call Office in three weeks to discuss medication change .

## 2022-08-02 ENCOUNTER — Telehealth: Payer: Self-pay | Admitting: Registered Nurse

## 2022-08-02 DIAGNOSIS — G8929 Other chronic pain: Secondary | ICD-10-CM

## 2022-08-02 DIAGNOSIS — M5416 Radiculopathy, lumbar region: Secondary | ICD-10-CM

## 2022-08-02 NOTE — Telephone Encounter (Signed)
Spoke with Leah Barton,  She reports increase intensity of lower back pain and right hip pain.  Will oirder X-rays at [facility] per patient request, she has a scheduled appointment with Dr Wynn Banker

## 2022-08-04 ENCOUNTER — Telehealth: Payer: Self-pay

## 2022-08-04 ENCOUNTER — Other Ambulatory Visit: Payer: Self-pay | Admitting: Registered Nurse

## 2022-08-04 NOTE — Telephone Encounter (Signed)
Patient left VM stating [facility] did not have her Rx, for her hydrocodone, called [facility] and they confirmed that got Rx on 5/16, however instructions stated not to fill until 5/21, they were able to fill Rx today 08/04/22. Called patient to advise Rx will be ready for pick up today 08/04/22.

## 2022-08-04 NOTE — Telephone Encounter (Signed)
Last fill per pmp  07/06/2022 07/02/2022 1  Hydrocodone-Acetamin 7.5-325 120.00 30 Eu Tho [id number] Pie ([id number]) 0/0 30.00 MME [facility] [place]

## 2022-08-16 ENCOUNTER — Telehealth: Payer: Self-pay | Admitting: Registered Nurse

## 2022-08-16 NOTE — Telephone Encounter (Signed)
Return Leah Barton call, no answer. She will see a orthopedist regarding left knee in [place], [place] due to Transportation.

## 2022-08-16 NOTE — Telephone Encounter (Signed)
Patient states new dosage is helping. Currently experiencing an urgent issue with left knee. Patient postponed appt. With Dr. Wynn Banker.

## 2022-08-25 ENCOUNTER — Other Ambulatory Visit: Payer: Self-pay | Admitting: Registered Nurse

## 2022-08-26 ENCOUNTER — Encounter: Payer: Medicare Other | Admitting: Physical Medicine & Rehabilitation

## 2022-08-30 NOTE — Telephone Encounter (Signed)
PMP was Reviewed.  Hydrocodone e- scribed today.  Call placed to Leah Barton regarding the above, she verbalizes understanding.

## 2022-09-22 ENCOUNTER — Encounter: Payer: Self-pay | Admitting: Registered Nurse

## 2022-09-22 ENCOUNTER — Encounter: Payer: Medicare Other | Attending: Physical Medicine & Rehabilitation | Admitting: Registered Nurse

## 2022-09-22 VITALS — BP 123/74 | HR 85 | Ht 60.0 in | Wt 196.0 lb

## 2022-09-22 DIAGNOSIS — M5416 Radiculopathy, lumbar region: Secondary | ICD-10-CM | POA: Diagnosis present

## 2022-09-22 DIAGNOSIS — G894 Chronic pain syndrome: Secondary | ICD-10-CM | POA: Diagnosis present

## 2022-09-22 DIAGNOSIS — M797 Fibromyalgia: Secondary | ICD-10-CM | POA: Diagnosis present

## 2022-09-22 DIAGNOSIS — M7061 Trochanteric bursitis, right hip: Secondary | ICD-10-CM | POA: Insufficient documentation

## 2022-09-22 DIAGNOSIS — M48061 Spinal stenosis, lumbar region without neurogenic claudication: Secondary | ICD-10-CM | POA: Insufficient documentation

## 2022-09-22 DIAGNOSIS — Z5181 Encounter for therapeutic drug level monitoring: Secondary | ICD-10-CM | POA: Diagnosis present

## 2022-09-22 DIAGNOSIS — Z79891 Long term (current) use of opiate analgesic: Secondary | ICD-10-CM | POA: Diagnosis present

## 2022-09-22 MED ORDER — HYDROCODONE-ACETAMINOPHEN 10-325 MG PO TABS: 1.0000 | ORAL_TABLET | Freq: Four times a day (QID) | ORAL | 0 refills | Status: DC | PRN

## 2022-09-22 NOTE — Progress Notes (Signed)
Subjective:    Patient ID: Leah Barton, female    DOB: 05/09/1950, 72 y.o.   MRN: [id number]  HPI: Leah Barton is a 72 y.o. female who returns for follow up appointment for chronic pain and medication refill. She states her pain is located in her lower back radiating into her right hip. She rates her pain 3. Her current exercise regime is walking and performing stretching exercises.  Leah Barton Morphine equivalent is 40.00 MME.   Last Oral Swab was Performed on 06/01/2022, it was consistent.     Pain Inventory Average Pain 6 Pain Right Now 3 My pain is constant, sharp, burning, dull, stabbing, tingling, and aching  In the last 24 hours, has pain interfered with the following? General activity 3 Relation with others 3 Enjoyment of life 3 What TIME of day is your pain at its worst? morning  and evening Sleep (in general) Fair  Pain is worse with: unsure Pain improves with: rest, heat/ice, pacing activities, medication, and injections Relief from Meds: 7  No family history on file. Social History   Socioeconomic History   Marital status: Married    Spouse name: Not on file   Number of children: Not on file   Years of education: Not on file   Highest education level: Not on file  Occupational History   Not on file  Tobacco Use   Smoking status: Never   Smokeless tobacco: Never  Vaping Use   Vaping Use: Never used  Substance and Sexual Activity   Alcohol use: No   Drug use: No   Sexual activity: Not on file  Other Topics Concern   Not on file  Social History Narrative   Not on file   Social Determinants of Health   Financial Resource Strain: Not on file  Food Insecurity: Not on file  Transportation Needs: Not on file  Physical Activity: Not on file  Stress: Not on file  Social Connections: Not on file   Past Surgical History:  Procedure Laterality Date   ANKLE SURGERY     ASPIRATION OF ABSCESS     CHOLECYSTECTOMY     DE QUERVAIN'S RELEASE      grastricbypas     HERNIA REPAIR     NASAL SEPTUM SURGERY     TOTAL VAGINAL HYSTERECTOMY     TUBAL LIGATION     Past Surgical History:  Procedure Laterality Date   ANKLE SURGERY     ASPIRATION OF ABSCESS     CHOLECYSTECTOMY     DE QUERVAIN'S RELEASE     grastricbypas     HERNIA REPAIR     NASAL SEPTUM SURGERY     TOTAL VAGINAL HYSTERECTOMY     TUBAL LIGATION     No past medical history on file. There were no vitals taken for this visit.  Opioid Risk Score:   Fall Risk Score:  `1  Depression screen PHQ 2/9     07/29/2022    1:14 PM 06/01/2022    2:18 PM 03/03/2022    1:13 PM 01/06/2022    1:37 PM 11/10/2021    2:02 PM 07/22/2021    1:43 PM 05/25/2021   12:56 PM  Depression screen PHQ 2/9  Decreased Interest 1 0 0 1 1 1  0  Down, Depressed, Hopeless 1 0 0 1 1 1  0  PHQ - 2 Score 2 0 0 2 2 2  0  Suicidal thoughts    1        Review  of Systems  Musculoskeletal:  Positive for back pain.       Lt knee Rt hip pain  All other systems reviewed and are negative.      Objective:   Physical Exam Vitals and nursing note reviewed.  Constitutional:      Appearance: Normal appearance. She is obese.  Cardiovascular:     Rate and Rhythm: Normal rate and regular rhythm.     Pulses: Normal pulses.     Heart sounds: Normal heart sounds.  Pulmonary:     Effort: Pulmonary effort is normal.     Breath sounds: Normal breath sounds.  Musculoskeletal:     Cervical back: Normal range of motion and neck supple.     Comments: Normal Muscle Bulk and Muscle Testing Reveals:  Upper Extremities: Full ROM and Muscle Strength 5/5  Lumbar Paraspinal Tenderness: L-3-L-5 Bilateral Greater Trochanter Tenderness: R>L Lower Extremities: Full ROM and Muscle Strength 5/5 Arises from Table slowly using cane for support Antalgic  Gait     Skin:    General: Skin is warm and dry.  Neurological:     Mental Status: She is alert and oriented to person, place, and time.  Psychiatric:        Mood and  Affect: Mood normal.        Behavior: Behavior normal.         Assessment & Plan:  1. Chronic low back pain with substantial spondylosis and scoliosis by MRI per Dr. Riley Kill note. 09/22/2022. Continue: Hydrocodone 10/325 mg one tablet every 6 hours as needed #120. Second script sent for the following month. We will continue the opioid monitoring program, this consists of regular clinic visits, examinations, urine drug screen, pill counts as well as use of [place] Controlled Substance Reporting system. A 12 month History has been reviewed on the [place] Controlled Substance Reporting System on 09/22/2022. 2. Lumbar Facet Arthropathy/ SI Joint Dysfunction: Continue with Facet Stretches: S/P  MBB on 07/12/2017. 09/22/2022.. 3. Lumbar Radiculitis: Continue Gabapentin: Continue HEP as Tolerated. Continue to Monitor. 07/10//2024 4. Fibromyalgia: Continue Gabapentin and HEP as Tolerated. 09/22/2022. 5. Bilateral Greater Trochanter Bursitis R>L: Ortho Following. Leah Barton reports she received injection by Orthopedist.  Continue to Alternate with Ice and Heat Therapy. Continue to monitor. 09/22/2022 6. Right Hip Pain: Continue HEP as tolerated.  Continue to Monitor.09/22/2022   F/U in 2 months

## 2022-11-25 ENCOUNTER — Encounter: Payer: Self-pay | Admitting: Registered Nurse

## 2022-11-25 ENCOUNTER — Encounter: Payer: Medicare Other | Attending: Physical Medicine & Rehabilitation | Admitting: Registered Nurse

## 2022-11-25 VITALS — BP 117/82 | HR 78 | Ht 60.0 in | Wt 197.0 lb

## 2022-11-25 DIAGNOSIS — G894 Chronic pain syndrome: Secondary | ICD-10-CM | POA: Diagnosis present

## 2022-11-25 DIAGNOSIS — M25551 Pain in right hip: Secondary | ICD-10-CM | POA: Diagnosis present

## 2022-11-25 DIAGNOSIS — G8929 Other chronic pain: Secondary | ICD-10-CM | POA: Insufficient documentation

## 2022-11-25 DIAGNOSIS — M47816 Spondylosis without myelopathy or radiculopathy, lumbar region: Secondary | ICD-10-CM | POA: Insufficient documentation

## 2022-11-25 DIAGNOSIS — M25562 Pain in left knee: Secondary | ICD-10-CM | POA: Diagnosis present

## 2022-11-25 DIAGNOSIS — Z5181 Encounter for therapeutic drug level monitoring: Secondary | ICD-10-CM | POA: Insufficient documentation

## 2022-11-25 DIAGNOSIS — M7061 Trochanteric bursitis, right hip: Secondary | ICD-10-CM | POA: Insufficient documentation

## 2022-11-25 DIAGNOSIS — M797 Fibromyalgia: Secondary | ICD-10-CM | POA: Diagnosis present

## 2022-11-25 DIAGNOSIS — M7062 Trochanteric bursitis, left hip: Secondary | ICD-10-CM | POA: Insufficient documentation

## 2022-11-25 DIAGNOSIS — Z79891 Long term (current) use of opiate analgesic: Secondary | ICD-10-CM | POA: Insufficient documentation

## 2022-11-25 DIAGNOSIS — M25561 Pain in right knee: Secondary | ICD-10-CM | POA: Diagnosis present

## 2022-11-25 MED ORDER — HYDROCODONE-ACETAMINOPHEN 10-325 MG PO TABS: 1.0000 | ORAL_TABLET | Freq: Four times a day (QID) | ORAL | 0 refills | Status: DC | PRN

## 2022-11-25 MED ORDER — GABAPENTIN 300 MG PO CAPS: 300.0000 mg | ORAL_CAPSULE | Freq: Two times a day (BID) | ORAL | 2 refills | Status: DC

## 2022-11-25 NOTE — Progress Notes (Signed)
Subjective:    Patient ID: Leah Barton, female    DOB: [birth year], 72 y.o.   MRN: [id number]  HPI: Leah Barton is a 72 y.o. female who returns for follow up appointment for chronic pain and medication refill. She states her pain is located in her lower bac, bilateral hips R>L and bilateral knee pain. She also reports generalized joint pain. She rates her pain 7. Her current exercise regime is walking short distances using her walker or cane and performing stretching exercises.  Ms. Cundiff Morphine equivalent is 40.00 MME. She is also prescribed Clonazepam by Dr. [name] .We have discussed the black box warning of using opioids and benzodiazepines. I highlighted the dangers of using these drugs together and discussed the adverse events including respiratory suppression, overdose, cognitive impairment and importance of compliance with current regimen. We will continue to monitor and adjust as indicated.    Oral Swab was Performed today.     Pain Inventory Average Pain 7 Pain Right Now 7 My pain is constant, sharp, burning, dull, stabbing, tingling, and aching  In the last 24 hours, has pain interfered with the following? General activity 8 Relation with others 8 Enjoyment of life 8 What TIME of day is your pain at its worst? night Sleep (in general) Fair  Pain is worse with: sitting and some activites Pain improves with: pacing activities, medication, and injections Relief from Meds:  fair      No family history on file. Social History   Socioeconomic History   Marital status: Married    Spouse name: Not on file   Number of children: Not on file   Years of education: Not on file   Highest education level: Not on file  Occupational History   Not on file  Tobacco Use   Smoking status: Never   Smokeless tobacco: Never  Vaping Use   Vaping status: Never Used  Substance and Sexual Activity   Alcohol use: No   Drug use: No   Sexual activity: Not on file  Other Topics  Concern   Not on file  Social History Narrative   Not on file   Social Determinants of Health   Financial Resource Strain: Not on file  Food Insecurity: Not on file  Transportation Needs: Not on file  Physical Activity: Not on file  Stress: Not on file  Social Connections: Not on file   Past Surgical History:  Procedure Laterality Date   ANKLE SURGERY     ASPIRATION OF ABSCESS     CHOLECYSTECTOMY     DE QUERVAIN'S RELEASE     grastricbypas     HERNIA REPAIR     NASAL SEPTUM SURGERY     TOTAL VAGINAL HYSTERECTOMY     TUBAL LIGATION     No past medical history on file. BP 117/82   Pulse 78   Ht 5' (1.524 m)   Wt 197 lb (89.4 kg)   SpO2 94%   BMI 38.47 kg/m   Opioid Risk Score:   Fall Risk Score:  `1  Depression screen PHQ 2/9     09/22/2022    1:24 PM 07/29/2022    1:14 PM 06/01/2022    2:18 PM 03/03/2022    1:13 PM 01/06/2022    1:37 PM 11/10/2021    2:02 PM 07/22/2021    1:43 PM  Depression screen PHQ 2/9  Decreased Interest 3 1 0 0 1 1 1   Down, Depressed, Hopeless 2 1 0 0 1 1 1  PHQ - 2 Score 5 2 0 0 2 2 2   Altered sleeping 3        Tired, decreased energy 3        Change in appetite 3        Feeling bad or failure about yourself  2        Trouble concentrating 2        Moving slowly or fidgety/restless 0        Suicidal thoughts 0    1    PHQ-9 Score 18          Review of Systems  Musculoskeletal:  Positive for back pain and gait problem.       Right hip and right upper leg pain  All other systems reviewed and are negative.      Objective:   Physical Exam Vitals and nursing note reviewed.  Constitutional:      Appearance: Normal appearance.  Cardiovascular:     Rate and Rhythm: Normal rate and regular rhythm.     Pulses: Normal pulses.     Heart sounds: Normal heart sounds.  Pulmonary:     Effort: Pulmonary effort is normal.     Breath sounds: Normal breath sounds.  Musculoskeletal:     Cervical back: Normal range of motion and neck  supple.     Comments: Normal Muscle Bulk and Muscle Testing Reveals:  Upper Extremities: Full ROM and Muscle Strength 5/5 Bilateral AC Joint Tenderness  Thoracic Paraspinal Tenderness: T-7-T-9 Lumbar Paraspinal Tenderness: L-3- L-5 Lower Extremities: Full ROM and Muscle Strength 5/5 Arises from Chair slowly using cane for support Antalgic  Gait     Skin:    General: Skin is warm and dry.  Neurological:     Mental Status: She is alert and oriented to person, place, and time.  Psychiatric:        Mood and Affect: Mood normal.        Behavior: Behavior normal.         Assessment & Plan:  1. Chronic low back pain with substantial spondylosis and scoliosis by MRI per Dr. Riley Kill note. 11/25/2022. Refilled: Hydrocodone 10/325 mg one tablet every 6 hours as needed #120. Second script sent for the following month. We will continue the opioid monitoring program, this consists of regular clinic visits, examinations, urine drug screen, pill counts as well as use of [place] Controlled Substance Reporting system. A 12 month History has been reviewed on the [place] Controlled Substance Reporting System on 11/25/2022. 2. Lumbar Facet Arthropathy/ SI Joint Dysfunction: Continue with Facet Stretches: S/P  MBB on 07/12/2017. 11/25/2022.. 3. Lumbar Radiculitis: Continue Gabapentin: Continue HEP as Tolerated. Continue to Monitor. 09/12//2024 4. Fibromyalgia: Continue Gabapentin and HEP as Tolerated. 11/25/2022. 5. Bilateral Greater Trochanter Bursitis R>L: Ortho Following. Continue to Alternate with Ice and Heat Therapy. Continue to monitor. 11/25/2022 6. Right Hip Pain: Ortho Following.. Continue HEP as tolerated.  Continue to Monitor.11/25/2022   F/U in 2 months

## 2022-11-29 LAB — DRUG TOX MONITOR 1 W/CONF, ORAL FLD
Amobarbital: NEGATIVE ng/mL (ref <10)
Amphetamines: NEGATIVE ng/mL (ref <10)
Barbiturates: POSITIVE ng/mL — AB (ref <10)
Benzodiazepines: NEGATIVE ng/mL (ref <0.50)
Buprenorphine: NEGATIVE ng/mL (ref <0.10)
Butalbital: NEGATIVE ng/mL (ref <10)
Cocaine: NEGATIVE ng/mL (ref <5.0)
Codeine: NEGATIVE ng/mL (ref <2.5)
Dihydrocodeine: 32.3 ng/mL — ABNORMAL HIGH (ref <2.5)
Fentanyl: NEGATIVE ng/mL (ref <0.10)
Heroin Metabolite: NEGATIVE ng/mL (ref <1.0)
Hydrocodone: >250 ng/mL — ABNORMAL HIGH (ref <2.5)
Hydromorphone: NEGATIVE ng/mL (ref <2.5)
MARIJUANA: NEGATIVE ng/mL (ref <2.5)
MDMA: NEGATIVE ng/mL (ref <10)
Meprobamate: NEGATIVE ng/mL (ref <2.5)
Methadone: NEGATIVE ng/mL (ref <5.0)
Morphine: NEGATIVE ng/mL (ref <2.5)
Nicotine Metabolite: NEGATIVE ng/mL (ref <5.0)
Norhydrocodone: 17.9 ng/mL — ABNORMAL HIGH (ref <2.5)
Noroxycodone: NEGATIVE ng/mL (ref <2.5)
Opiates: POSITIVE ng/mL — AB (ref <2.5)
Oxycodone: NEGATIVE ng/mL (ref <2.5)
Oxymorphone: NEGATIVE ng/mL (ref <2.5)
Pentobarbital: NEGATIVE ng/mL (ref <10)
Phencyclidine: NEGATIVE ng/mL (ref <10)
Phenobarbital: >500 ng/mL — ABNORMAL HIGH (ref <10)
Secobarbital: NEGATIVE ng/mL (ref <10)
Tapentadol: NEGATIVE ng/mL (ref <5.0)
Tramadol: NEGATIVE ng/mL (ref <5.0)
Zolpidem: NEGATIVE ng/mL (ref <5.0)

## 2022-11-29 LAB — DRUG TOX ALC METAB W/CON, ORAL FLD: Alcohol Metabolite: NEGATIVE ng/mL (ref <25)

## 2023-01-25 NOTE — Progress Notes (Unsigned)
Subjective:    Patient ID: Leah Barton, female    DOB: 1950/09/19, 72 y.o.   MRN: [id number]  HPI: Leah Barton is a 72 y.o. female who returns for follow up appointment for chronic pain and medication refill. She states her pain is located in her lower back and bilateral hips R>L. She rates her pain 5. Her current exercise regime is walking short distances with her walker.  Leah Barton Morphine equivalent is 40.00  MME. Last Oral Swab was Performed on 11/25/2022, it was consistent.        Pain Inventory Average Pain 6 Pain Right Now 5 My pain is constant  In the last 24 hours, has pain interfered with the following? General activity 7 Relation with others 7 Enjoyment of life 7 What TIME of day is your pain at its worst? varies Sleep (in general) Fair  Pain is worse with: walking, bending, sitting, inactivity, standing, and some activites Pain improves with: heat/ice and medication Relief from Meds: 6  No family history on file. Social History   Socioeconomic History   Marital status: Married    Spouse name: Not on file   Number of children: Not on file   Years of education: Not on file   Highest education level: Not on file  Occupational History   Not on file  Tobacco Use   Smoking status: Never   Smokeless tobacco: Never  Vaping Use   Vaping status: Never Used  Substance and Sexual Activity   Alcohol use: No   Drug use: No   Sexual activity: Not on file  Other Topics Concern   Not on file  Social History Narrative   Not on file   Social Determinants of Health   Financial Resource Strain: Not on file  Food Insecurity: Not on file  Transportation Needs: Not on file  Physical Activity: Not on file  Stress: Not on file  Social Connections: Not on file   Past Surgical History:  Procedure Laterality Date   ANKLE SURGERY     ASPIRATION OF ABSCESS     CHOLECYSTECTOMY     DE QUERVAIN'S RELEASE     grastricbypas     HERNIA REPAIR     NASAL SEPTUM SURGERY      TOTAL VAGINAL HYSTERECTOMY     TUBAL LIGATION     Past Surgical History:  Procedure Laterality Date   ANKLE SURGERY     ASPIRATION OF ABSCESS     CHOLECYSTECTOMY     DE QUERVAIN'S RELEASE     grastricbypas     HERNIA REPAIR     NASAL SEPTUM SURGERY     TOTAL VAGINAL HYSTERECTOMY     TUBAL LIGATION     No past medical history on file. There were no vitals taken for this visit.  Opioid Risk Score:   Fall Risk Score:  `1  Depression screen PHQ 2/9     11/25/2022   12:56 PM 09/22/2022    1:24 PM 07/29/2022    1:14 PM 06/01/2022    2:18 PM 03/03/2022    1:13 PM 01/06/2022    1:37 PM 11/10/2021    2:02 PM  Depression screen PHQ 2/9  Decreased Interest 1 3 1  0 0 1 1  Down, Depressed, Hopeless 1 2 1  0 0 1 1  PHQ - 2 Score 2 5 2  0 0 2 2  Altered sleeping  3       Tired, decreased energy  3  Change in appetite  3       Feeling bad or failure about yourself   2       Trouble concentrating  2       Moving slowly or fidgety/restless  0       Suicidal thoughts  0    1   PHQ-9 Score  18         Review of Systems  Musculoskeletal:  Positive for back pain.       Right hip pain   All other systems reviewed and are negative.     Objective:   Physical Exam Vitals and nursing note reviewed.  Constitutional:      Appearance: Normal appearance.  Cardiovascular:     Rate and Rhythm: Normal rate and regular rhythm.     Pulses: Normal pulses.     Heart sounds: Normal heart sounds.  Pulmonary:     Effort: Pulmonary effort is normal.     Breath sounds: Normal breath sounds.  Musculoskeletal:     Comments: Normal Muscle Bulk and Muscle Testing Reveals:  Upper Extremities: Full ROM and Muscle Strength 5/5  Lumbar Paraspinal Tenderness: L-4-L-5 Bilateral Greater Trochanter Tenderness Lower Extremities: Full ROM and Muscle Strength 5/5 Arises from Chair slowly using walker for support Antalgic  Gait     Skin:    General: Skin is warm and dry.  Neurological:     Mental  Status: She is alert and oriented to person, place, and time.  Psychiatric:        Mood and Affect: Mood normal.        Behavior: Behavior normal.         Assessment & Plan:  1. Chronic low back pain with substantial spondylosis and scoliosis by MRI per Dr. Riley Kill note. 01/26/2023. Refilled: Hydrocodone 10/325 mg one tablet every 6 hours as needed #120. Second script sent for the following month. We will continue the opioid monitoring program, this consists of regular clinic visits, examinations, urine drug screen, pill counts as well as use of [place] Controlled Substance Reporting system. A 12 month History has been reviewed on the [place] Controlled Substance Reporting System on 01/26/2023. 2. Lumbar Facet Arthropathy/ SI Joint Dysfunction: Continue with Facet Stretches: S/P  MBB on 07/12/2017. 01/26/2023.. 3. Lumbar Radiculitis: Continue Gabapentin: Continue HEP as Tolerated. Continue to Monitor. 11/13//2024 4. Fibromyalgia: Continue Gabapentin and HEP as Tolerated. 01/26/2023. 5. Bilateral Greater Trochanter Bursitis R>L: Ortho Following. RX: Medrol Dose pak .Continue to Alternate with Ice and Heat Therapy. Continue to monitor. 01/26/2023 6. Right Hip Pain: Ortho Following.. Continue HEP as tolerated.  Continue to Monitor.01/26/2023   F/U in 2 months

## 2023-01-26 ENCOUNTER — Encounter: Payer: Self-pay | Admitting: Registered Nurse

## 2023-01-26 ENCOUNTER — Encounter: Payer: Medicare Other | Attending: Physical Medicine & Rehabilitation | Admitting: Registered Nurse

## 2023-01-26 VITALS — BP 125/71 | HR 84 | Ht 60.0 in | Wt 190.0 lb

## 2023-01-26 DIAGNOSIS — M7062 Trochanteric bursitis, left hip: Secondary | ICD-10-CM | POA: Diagnosis present

## 2023-01-26 DIAGNOSIS — M7061 Trochanteric bursitis, right hip: Secondary | ICD-10-CM | POA: Diagnosis present

## 2023-01-26 DIAGNOSIS — Z79891 Long term (current) use of opiate analgesic: Secondary | ICD-10-CM | POA: Diagnosis present

## 2023-01-26 DIAGNOSIS — M25551 Pain in right hip: Secondary | ICD-10-CM | POA: Diagnosis not present

## 2023-01-26 DIAGNOSIS — G894 Chronic pain syndrome: Secondary | ICD-10-CM | POA: Insufficient documentation

## 2023-01-26 DIAGNOSIS — Z5181 Encounter for therapeutic drug level monitoring: Secondary | ICD-10-CM | POA: Insufficient documentation

## 2023-01-26 DIAGNOSIS — M47816 Spondylosis without myelopathy or radiculopathy, lumbar region: Secondary | ICD-10-CM | POA: Diagnosis not present

## 2023-01-26 DIAGNOSIS — G8929 Other chronic pain: Secondary | ICD-10-CM | POA: Insufficient documentation

## 2023-01-26 MED ORDER — METHYLPREDNISOLONE 4 MG PO TBPK
ORAL_TABLET | ORAL | 0 refills | Status: DC
Start: 2023-01-26 — End: 2023-05-19

## 2023-01-26 MED ORDER — HYDROCODONE-ACETAMINOPHEN 10-325 MG PO TABS: 1.0000 | ORAL_TABLET | Freq: Four times a day (QID) | ORAL | 0 refills | Status: DC | PRN

## 2023-03-23 NOTE — Progress Notes (Signed)
 Subjective:    Patient ID: Leah Barton, female    DOB: 16-Nov-1950, 73 y.o.   MRN: [id number]  HPI: Leah Barton is a 73 y.o. female who returns for follow up appointment for chronic pain and medication refill. Leah Barton states her pain is located in her lower back and right hip. Leah Barton rates her pain 5. Her current exercise regime is walking and performing stretching exercises.  Leah Barton Morphine equivalent is 40.00 MME. Leah Barton  is also prescribed Clonazepam  by Dr. Toy .We have discussed the black box warning of using opioids and benzodiazepines. I highlighted the dangers of using these drugs together and discussed the adverse events including respiratory suppression, overdose, cognitive impairment and importance of compliance with current regimen. We will continue to monitor and adjust as indicated.     Oral Swab was Performed today.      Pain Inventory Average Pain 5 Pain Right Now 5 My pain is sharp, burning, dull, stabbing, tingling, and aching  In the last 24 hours, has pain interfered with the following? General activity 8 Relation with others 8 Enjoyment of life 8 What TIME of day is your pain at its worst? morning , daytime, and evening Sleep (in general) Fair  Pain is worse with: walking, bending, sitting, inactivity, and unsure Pain improves with: rest and medication Relief from Meds: 5  No family history on file. Social History   Socioeconomic History   Marital status: Married    Spouse name: Not on file   Number of children: Not on file   Years of education: Not on file   Highest education level: Not on file  Occupational History   Not on file  Tobacco Use   Smoking status: Never   Smokeless tobacco: Never  Vaping Use   Vaping status: Never Used  Substance and Sexual Activity   Alcohol use: No   Drug use: No   Sexual activity: Not on file  Other Topics Concern   Not on file  Social History Narrative   Not on file   Social Drivers of Health    Financial Resource Strain: Not on file  Food Insecurity: Not on file  Transportation Needs: Not on file  Physical Activity: Not on file  Stress: Not on file  Social Connections: Not on file   Past Surgical History:  Procedure Laterality Date   ANKLE SURGERY     ASPIRATION OF ABSCESS     CHOLECYSTECTOMY     DE QUERVAIN'S RELEASE     grastricbypas     HERNIA REPAIR     NASAL SEPTUM SURGERY     TOTAL VAGINAL HYSTERECTOMY     TUBAL LIGATION     Past Surgical History:  Procedure Laterality Date   ANKLE SURGERY     ASPIRATION OF ABSCESS     CHOLECYSTECTOMY     DE QUERVAIN'S RELEASE     grastricbypas     HERNIA REPAIR     NASAL SEPTUM SURGERY     TOTAL VAGINAL HYSTERECTOMY     TUBAL LIGATION     No past medical history on file. There were no vitals taken for this visit.  Opioid Risk Score:   Fall Risk Score:  `1  Depression screen PHQ 2/9     01/26/2023   12:51 PM 11/25/2022   12:56 PM 09/22/2022    1:24 PM 07/29/2022    1:14 PM 06/01/2022    2:18 PM 03/03/2022    1:13 PM 01/06/2022    1:37  PM  Depression screen PHQ 2/9  Decreased Interest 0 1 3 1  0 0 1  Down, Depressed, Hopeless 0 1 2 1  0 0 1  PHQ - 2 Score 0 2 5 2  0 0 2  Altered sleeping   3      Tired, decreased energy   3      Change in appetite   3      Feeling bad or failure about yourself    2      Trouble concentrating   2      Moving slowly or fidgety/restless   0      Suicidal thoughts   0    1  PHQ-9 Score   18        Review of Systems  Musculoskeletal:  Positive for back pain.       Hip pain   All other systems reviewed and are negative.     Objective:   Physical Exam Vitals and nursing note reviewed.  Constitutional:      Appearance: Normal appearance.  Cardiovascular:     Rate and Rhythm: Normal rate and regular rhythm.     Pulses: Normal pulses.     Heart sounds: Normal heart sounds.  Pulmonary:     Effort: Pulmonary effort is normal.     Breath sounds: Normal breath sounds.   Musculoskeletal:     Comments: Normal Muscle Bulk and Muscle Testing Reveals:  Upper Extremities: Full ROM and Muscle Strength 5/5 Bilateral AC Joint Tenderness Lumbar Paraspinal Tenderness: L-3-L-5 Bilateral Greater Trochanter Tenderness Lower Extremities: Full ROM and Muscle Strength 5/5 Arises from Chair slowly using cane for support Antalgic  Gait     Skin:    General: Skin is warm and dry.  Neurological:     Mental Status: Leah Barton is alert and oriented to person, place, and time.  Psychiatric:        Mood and Affect: Mood normal.        Behavior: Behavior normal.         Assessment & Plan:  1. Chronic low back pain with substantial spondylosis and scoliosis by MRI per Dr. Babs note. 03/24/2023. Refilled: Hydrocodone  10/325 mg one tablet every 6 hours as needed #120. Second script sent for the following month. We will continue the opioid monitoring program, this consists of regular clinic visits, examinations, urine drug screen, pill counts as well as use of [place]  Controlled Substance Reporting system. A 12 month History has been reviewed on the [place]  Controlled Substance Reporting System on 03/24/2023. 2. Lumbar Facet Arthropathy/ SI Joint Dysfunction: Continue with Facet Stretches: S/P  MBB on 07/12/2017. 03/24/2023. 3. Lumbar Radiculitis: Continue Gabapentin : Continue HEP as Tolerated. Continue to Monitor. 01/09//2025 4. Fibromyalgia: Continue Gabapentin  and HEP as Tolerated. 01/1092025. 5. Bilateral Greater Trochanter Bursitis R>L: Ortho Following. Continue to Alternate with Ice and Heat Therapy. Continue to monitor. 03/24/2023 6. Right Hip Pain: Ortho Following.. Continue HEP as tolerated.  Continue to Monitor.03/24/2023   F/U in 2 months

## 2023-03-24 ENCOUNTER — Encounter: Payer: Medicare Other | Attending: Physical Medicine & Rehabilitation | Admitting: Registered Nurse

## 2023-03-24 VITALS — BP 101/68 | HR 90 | Ht 60.0 in | Wt 187.0 lb

## 2023-03-24 DIAGNOSIS — Z79891 Long term (current) use of opiate analgesic: Secondary | ICD-10-CM | POA: Diagnosis present

## 2023-03-24 DIAGNOSIS — M47816 Spondylosis without myelopathy or radiculopathy, lumbar region: Secondary | ICD-10-CM

## 2023-03-24 DIAGNOSIS — Z5181 Encounter for therapeutic drug level monitoring: Secondary | ICD-10-CM | POA: Diagnosis present

## 2023-03-24 DIAGNOSIS — G894 Chronic pain syndrome: Secondary | ICD-10-CM | POA: Diagnosis present

## 2023-03-24 DIAGNOSIS — G8929 Other chronic pain: Secondary | ICD-10-CM | POA: Diagnosis present

## 2023-03-24 DIAGNOSIS — M797 Fibromyalgia: Secondary | ICD-10-CM

## 2023-03-24 DIAGNOSIS — M25551 Pain in right hip: Secondary | ICD-10-CM

## 2023-03-24 DIAGNOSIS — M7061 Trochanteric bursitis, right hip: Secondary | ICD-10-CM

## 2023-03-24 MED ORDER — HYDROCODONE-ACETAMINOPHEN 10-325 MG PO TABS: 1.0000 | ORAL_TABLET | Freq: Four times a day (QID) | ORAL | 0 refills | Status: DC | PRN

## 2023-03-28 LAB — DRUG TOX MONITOR 1 W/CONF, ORAL FLD
Alprazolam: NEGATIVE ng/mL (ref <0.50)
Aminoclonazepam: 1.52 ng/mL — ABNORMAL HIGH (ref <0.50)
Amobarbital: NEGATIVE ng/mL (ref <10)
Amphetamines: NEGATIVE ng/mL (ref <10)
Barbiturates: POSITIVE ng/mL — AB (ref <10)
Benzodiazepines: POSITIVE ng/mL — AB (ref <0.50)
Buprenorphine: NEGATIVE ng/mL (ref <0.10)
Butalbital: NEGATIVE ng/mL (ref <10)
Chlordiazepoxide: NEGATIVE ng/mL (ref <0.50)
Clonazepam: 0.52 ng/mL — ABNORMAL HIGH (ref <0.50)
Cocaine: NEGATIVE ng/mL (ref <5.0)
Codeine: NEGATIVE ng/mL (ref <2.5)
Diazepam: NEGATIVE ng/mL (ref <0.50)
Dihydrocodeine: 9.1 ng/mL — ABNORMAL HIGH (ref <2.5)
Fentanyl: NEGATIVE ng/mL (ref <0.10)
Flunitrazepam: NEGATIVE ng/mL (ref <0.50)
Flurazepam: NEGATIVE ng/mL (ref <0.50)
Heroin Metabolite: NEGATIVE ng/mL (ref <1.0)
Hydrocodone: 56.6 ng/mL — ABNORMAL HIGH (ref <2.5)
Hydromorphone: NEGATIVE ng/mL (ref <2.5)
Lorazepam: NEGATIVE ng/mL (ref <0.50)
MARIJUANA: NEGATIVE ng/mL (ref <2.5)
MDMA: NEGATIVE ng/mL (ref <10)
Meprobamate: NEGATIVE ng/mL (ref <2.5)
Methadone: NEGATIVE ng/mL (ref <5.0)
Midazolam: NEGATIVE ng/mL (ref <0.50)
Morphine: NEGATIVE ng/mL (ref <2.5)
Nicotine Metabolite: NEGATIVE ng/mL (ref <5.0)
Nordiazepam: NEGATIVE ng/mL (ref <0.50)
Norhydrocodone: 7.8 ng/mL — ABNORMAL HIGH (ref <2.5)
Noroxycodone: NEGATIVE ng/mL (ref <2.5)
Opiates: POSITIVE ng/mL — AB (ref <2.5)
Oxazepam: NEGATIVE ng/mL (ref <0.50)
Oxycodone: NEGATIVE ng/mL (ref <2.5)
Oxymorphone: NEGATIVE ng/mL (ref <2.5)
Pentobarbital: NEGATIVE ng/mL (ref <10)
Phencyclidine: NEGATIVE ng/mL (ref <10)
Phenobarbital: 428 ng/mL — ABNORMAL HIGH (ref <10)
Secobarbital: NEGATIVE ng/mL (ref <10)
Tapentadol: NEGATIVE ng/mL (ref <5.0)
Temazepam: NEGATIVE ng/mL (ref <0.50)
Tramadol: NEGATIVE ng/mL (ref <5.0)
Triazolam: NEGATIVE ng/mL (ref <0.50)
Zolpidem: NEGATIVE ng/mL (ref <5.0)

## 2023-03-28 LAB — DRUG TOX ALC METAB W/CON, ORAL FLD: Alcohol Metabolite: NEGATIVE ng/mL (ref <25)

## 2023-03-29 ENCOUNTER — Encounter: Payer: Self-pay | Admitting: Registered Nurse

## 2023-05-18 NOTE — Progress Notes (Signed)
 Subjective:    Patient ID: Leah Barton, female    DOB: 30-Nov-1950, 73 y.o.   MRN: [id number]  HPI: Leah Barton is a 73 y.o. female who returns for follow up appointment for chronic pain and medication refill. She states her pain is located in her left shoulder, lower Her current exercise regime is walking and performing stretching exercises.   Leah Barton Morphine equivalent is 40.00 MME.  She is also prescribed Clonazepam by Dr. [name] .We have discussed the black box warning of using opioids and benzodiazepines. I highlighted the dangers of using these drugs together and discussed the adverse events including respiratory suppression, overdose, cognitive impairment and importance of compliance with current regimen. We will continue to monitor and adjust as indicated.   Last Oral Swab was Performed on 03/24/2023, it was consistent.     Pain Inventory Average Pain 7 Pain Right Now 3 My pain is constant, sharp, burning, dull, stabbing, tingling, and aching  In the last 24 hours, has pain interfered with the following? General activity 10 Relation with others 10 Enjoyment of life 8 What TIME of day is your pain at its worst? morning , daytime, evening, and night Sleep (in general) Fair  Pain is worse with: walking, bending, sitting, standing, and some activites Pain improves with: rest, medication, injections, and heat Relief from Meds: 9  No family history on file. Social History   Socioeconomic History   Marital status: Married    Spouse name: Not on file   Number of children: Not on file   Years of education: Not on file   Highest education level: Not on file  Occupational History   Not on file  Tobacco Use   Smoking status: Never   Smokeless tobacco: Never  Vaping Use   Vaping status: Never Used  Substance and Sexual Activity   Alcohol use: No   Drug use: No   Sexual activity: Not on file  Other Topics Concern   Not on file  Social History Narrative   Not on  file   Social Drivers of Health   Financial Resource Strain: Not on file  Food Insecurity: Not on file  Transportation Needs: Not on file  Physical Activity: Not on file  Stress: Not on file  Social Connections: Not on file   Past Surgical History:  Procedure Laterality Date   ANKLE SURGERY     ASPIRATION OF ABSCESS     CHOLECYSTECTOMY     DE QUERVAIN'S RELEASE     grastricbypas     HERNIA REPAIR     NASAL SEPTUM SURGERY     TOTAL VAGINAL HYSTERECTOMY     TUBAL LIGATION     Past Surgical History:  Procedure Laterality Date   ANKLE SURGERY     ASPIRATION OF ABSCESS     CHOLECYSTECTOMY     DE QUERVAIN'S RELEASE     grastricbypas     HERNIA REPAIR     NASAL SEPTUM SURGERY     TOTAL VAGINAL HYSTERECTOMY     TUBAL LIGATION     No past medical history on file. There were no vitals taken for this visit.  Opioid Risk Score:   Fall Risk Score:  `1  Depression screen [facility] 2/9     03/24/2023   12:54 PM 01/26/2023   12:51 PM 11/25/2022   12:56 PM 09/22/2022    1:24 PM 07/29/2022    1:14 PM 06/01/2022    2:18 PM 03/03/2022    1:13 PM  Depression screen PHQ 2/9  Decreased Interest 0 0 1 3 1  0 0  Down, Depressed, Hopeless 0 0 1 2 1  0 0  PHQ - 2 Score 0 0 2 5 2  0 0  Altered sleeping    3     Tired, decreased energy    3     Change in appetite    3     Feeling bad or failure about yourself     2     Trouble concentrating    2     Moving slowly or fidgety/restless    0     Suicidal thoughts    0     PHQ-9 Score    18       Review of Systems  Musculoskeletal:  Positive for gait problem.       Pain all over. Right hip, left shoulder, left arm, pain in all joints including knees  All other systems reviewed and are negative.      Objective:   Physical Exam Vitals and nursing note reviewed.  Constitutional:      Appearance: Normal appearance.  Cardiovascular:     Rate and Rhythm: Normal rate and regular rhythm.     Pulses: Normal pulses.     Heart sounds: Normal  heart sounds.  Pulmonary:     Effort: Pulmonary effort is normal.     Breath sounds: Normal breath sounds.  Skin:    General: Skin is warm and dry.  Neurological:     Mental Status: She is alert and oriented to person, place, and time.  Psychiatric:        Mood and Affect: Mood normal.        Behavior: Behavior normal.           Assessment & Plan:  1. Chronic low back pain with substantial spondylosis and scoliosis by MRI per Dr. Riley Kill note. 05/19/2023. Refilled: Hydrocodone 10/325 mg one tablet every 6 hours as needed #120. Second script sent for the following month. We will continue the opioid monitoring program, this consists of regular clinic visits, examinations, urine drug screen, pill counts as well as use of [place] Controlled Substance Reporting system. A 12 month History has been reviewed on the [place] Controlled Substance Reporting System on 05/19/2023. 2. Lumbar Facet Arthropathy/ SI Joint Dysfunction: Continue with Facet Stretches: S/P  MBB on 07/12/2017. 05/19/2023. 3. Lumbar Radiculitis: Continue Gabapentin: Continue HEP as Tolerated. Continue to Monitor. 03/06//2025 4. Fibromyalgia: Continue Gabapentin and HEP as Tolerated. 03/062025. 5. Bilateral Greater Trochanter Bursitis R>L: Ortho Following. Continue to Alternate with Ice and Heat Therapy. Continue to monitor. 05/19/2023 6. Right Hip Pain: Ortho Following.. Continue HEP as tolerated.  Continue to Monitor.05/19/2023   F/U in 2 months

## 2023-05-19 ENCOUNTER — Encounter: Payer: Medicare Other | Attending: Physical Medicine & Rehabilitation | Admitting: Registered Nurse

## 2023-05-19 ENCOUNTER — Encounter: Payer: Self-pay | Admitting: Registered Nurse

## 2023-05-19 VITALS — BP 123/83 | HR 83 | Ht 60.0 in | Wt 186.0 lb

## 2023-05-19 DIAGNOSIS — M7062 Trochanteric bursitis, left hip: Secondary | ICD-10-CM | POA: Diagnosis present

## 2023-05-19 DIAGNOSIS — Z5181 Encounter for therapeutic drug level monitoring: Secondary | ICD-10-CM

## 2023-05-19 DIAGNOSIS — M25551 Pain in right hip: Secondary | ICD-10-CM

## 2023-05-19 DIAGNOSIS — M7061 Trochanteric bursitis, right hip: Secondary | ICD-10-CM

## 2023-05-19 DIAGNOSIS — G894 Chronic pain syndrome: Secondary | ICD-10-CM | POA: Diagnosis not present

## 2023-05-19 DIAGNOSIS — G8929 Other chronic pain: Secondary | ICD-10-CM | POA: Diagnosis present

## 2023-05-19 DIAGNOSIS — M25512 Pain in left shoulder: Secondary | ICD-10-CM

## 2023-05-19 DIAGNOSIS — Z79891 Long term (current) use of opiate analgesic: Secondary | ICD-10-CM | POA: Diagnosis present

## 2023-05-19 DIAGNOSIS — M47816 Spondylosis without myelopathy or radiculopathy, lumbar region: Secondary | ICD-10-CM | POA: Diagnosis present

## 2023-05-19 DIAGNOSIS — M797 Fibromyalgia: Secondary | ICD-10-CM | POA: Diagnosis not present

## 2023-05-19 DIAGNOSIS — M25562 Pain in left knee: Secondary | ICD-10-CM

## 2023-05-19 MED ORDER — GABAPENTIN 300 MG PO CAPS: 300.0000 mg | ORAL_CAPSULE | Freq: Two times a day (BID) | ORAL | 2 refills | Status: DC

## 2023-05-19 MED ORDER — HYDROCODONE-ACETAMINOPHEN 10-325 MG PO TABS: 1.0000 | ORAL_TABLET | Freq: Four times a day (QID) | ORAL | 0 refills | Status: DC | PRN

## 2023-07-19 ENCOUNTER — Encounter: Attending: Physical Medicine & Rehabilitation | Admitting: Registered Nurse

## 2023-07-19 VITALS — BP 123/77 | HR 74 | Ht 60.0 in | Wt 190.0 lb

## 2023-07-19 DIAGNOSIS — M25551 Pain in right hip: Secondary | ICD-10-CM

## 2023-07-19 DIAGNOSIS — M797 Fibromyalgia: Secondary | ICD-10-CM

## 2023-07-19 DIAGNOSIS — Z5181 Encounter for therapeutic drug level monitoring: Secondary | ICD-10-CM

## 2023-07-19 DIAGNOSIS — Z79891 Long term (current) use of opiate analgesic: Secondary | ICD-10-CM

## 2023-07-19 DIAGNOSIS — M47816 Spondylosis without myelopathy or radiculopathy, lumbar region: Secondary | ICD-10-CM | POA: Diagnosis present

## 2023-07-19 DIAGNOSIS — G894 Chronic pain syndrome: Secondary | ICD-10-CM | POA: Diagnosis present

## 2023-07-19 DIAGNOSIS — G8929 Other chronic pain: Secondary | ICD-10-CM

## 2023-07-19 DIAGNOSIS — M25562 Pain in left knee: Secondary | ICD-10-CM

## 2023-07-19 DIAGNOSIS — M25561 Pain in right knee: Secondary | ICD-10-CM | POA: Diagnosis present

## 2023-07-19 MED ORDER — HYDROCODONE-ACETAMINOPHEN 10-325 MG PO TABS: 1.0000 | ORAL_TABLET | Freq: Four times a day (QID) | ORAL | 0 refills | Status: DC | PRN

## 2023-07-19 NOTE — Progress Notes (Signed)
 Subjective:    Patient ID: Leah Barton, female    DOB: Sep 14, 1950, 73 y.o.   MRN: [id number]  HPI: Leah Barton is a 73 y.o. female who returns for follow up appointment for chronic pain and medication refill. She states her pain is located in her lower back, right hip and bilateral knee pain L>R. She also reports  she has Fibro Pain She rates her pain 7. Her current exercise regime is walking and performing stretching exercises.  Leah Barton Morphine equivalent is 40.00 MME.  She is also prescribed Clonazepam  by Dr. Rose Conception .We have discussed the black box warning of using opioids and benzodiazepines. I highlighted the dangers of using these drugs together and discussed the adverse events including respiratory suppression, overdose, cognitive impairment and importance of compliance with current regimen. We will continue to monitor and adjust as indicated.  Oral Swab was performed today     Pain Inventory Average Pain 5 Pain Right Now 7 My pain is sharp and aching  In the last 24 hours, has pain interfered with the following? General activity 9 Relation with others 9 Enjoyment of life 9 What TIME of day is your pain at its worst? varies Sleep (in general) Fair  Pain is worse with: unsure Pain improves with: rest, therapy/exercise, pacing activities, and medication Relief from Meds: 9  No family history on file. Social History   Socioeconomic History   Marital status: Married    Spouse name: Not on file   Number of children: Not on file   Years of education: Not on file   Highest education level: Not on file  Occupational History   Not on file  Tobacco Use   Smoking status: Never   Smokeless tobacco: Never  Vaping Use   Vaping status: Never Used  Substance and Sexual Activity   Alcohol use: No   Drug use: No   Sexual activity: Not on file  Other Topics Concern   Not on file  Social History Narrative   Not on file   Social Drivers of Health   Financial  Resource Strain: Not on file  Food Insecurity: Not on file  Transportation Needs: Not on file  Physical Activity: Not on file  Stress: Not on file  Social Connections: Not on file   Past Surgical History:  Procedure Laterality Date   ANKLE SURGERY     ASPIRATION OF ABSCESS     CHOLECYSTECTOMY     DE QUERVAIN'S RELEASE     grastricbypas     HERNIA REPAIR     NASAL SEPTUM SURGERY     TOTAL VAGINAL HYSTERECTOMY     TUBAL LIGATION     Past Surgical History:  Procedure Laterality Date   ANKLE SURGERY     ASPIRATION OF ABSCESS     CHOLECYSTECTOMY     DE QUERVAIN'S RELEASE     grastricbypas     HERNIA REPAIR     NASAL SEPTUM SURGERY     TOTAL VAGINAL HYSTERECTOMY     TUBAL LIGATION     No past medical history on file. BP 123/77   Pulse 74   Ht 5' (1.524 m)   Wt 190 lb (86.2 kg)   SpO2 95%   BMI 37.11 kg/m   Opioid Risk Score:   Fall Risk Score:  `1  Depression screen [facility] 2/9     05/19/2023    1:33 PM 03/24/2023   12:54 PM 01/26/2023   12:51 PM 11/25/2022   12:56  PM 09/22/2022    1:24 PM 07/29/2022    1:14 PM 06/01/2022    2:18 PM  Depression screen PHQ 2/9  Decreased Interest 1 0 0 1 3 1  0  Down, Depressed, Hopeless 1 0 0 1 2 1  0  PHQ - 2 Score 2 0 0 2 5 2  0  Altered sleeping     3    Tired, decreased energy     3    Change in appetite     3    Feeling bad or failure about yourself      2    Trouble concentrating     2    Moving slowly or fidgety/restless     0    Suicidal thoughts     0    PHQ-9 Score     18      Review of Systems  Musculoskeletal:  Positive for back pain.       Bilateral shoulder pain Right hip pain  All other systems reviewed and are negative.     Objective:   Physical Exam Vitals and nursing note reviewed.  Constitutional:      Appearance: Normal appearance.  Cardiovascular:     Rate and Rhythm: Normal rate and regular rhythm.     Pulses: Normal pulses.     Heart sounds: Normal heart sounds.  Pulmonary:     Effort: Pulmonary  effort is normal.     Breath sounds: Normal breath sounds.  Musculoskeletal:     Comments: Normal Muscle Bulk and Muscle Testing Reveals:  Upper Extremities: Full ROM and Muscle Strength 5/5 Bilateral AC Joint Tenderness  Lumbar Paraspinal Tenderness: L-4- L-5 Bilateral Greater Trochanter Tenderness: R>L Lower Extremities: Full ROM and Muscle Strength 5/5 Arises from Chair slowly using cane for suppot Antalgic  Gait     Skin:    General: Skin is warm and dry.  Neurological:     Mental Status: She is alert and oriented to person, place, and time.  Psychiatric:        Mood and Affect: Mood normal.        Behavior: Behavior normal.         Assessment & Plan:  1. Chronic low back pain with substantial spondylosis and scoliosis by MRI per Dr. Rachel Budds note. 07/19/2023. Refilled: Hydrocodone  10/325 mg one tablet every 6 hours as needed #120. Second script sent for the following month. We will continue the opioid monitoring program, this consists of regular clinic visits, examinations, urine drug screen, pill counts as well as use of [place]  Controlled Substance Reporting system. A 12 month History has been reviewed on the [place]  Controlled Substance Reporting System on 07/19/2023. 2. Lumbar Facet Arthropathy/ SI Joint Dysfunction: Continue with Facet Stretches: S/P  MBB on 07/12/2017. 07/19/2023. 3. Lumbar Radiculitis: Continue Gabapentin : Continue HEP as Tolerated. Continue to Monitor. 05/06//2025 4. Fibromyalgia: Continue Gabapentin  and HEP as Tolerated. 05/062025. 5. Bilateral Greater Trochanter Bursitis R>L: Ortho Following. Continue to Alternate with Ice and Heat Therapy. Continue to monitor. 07/19/2023 6. Right Hip Pain: Ortho Following.. Continue HEP as tolerated.  Continue to Monitor.07/19/2023   F/U in 2 months

## 2023-07-23 ENCOUNTER — Encounter: Payer: Self-pay | Admitting: Registered Nurse

## 2023-07-24 LAB — DRUG TOX MONITOR 1 W/CONF, ORAL FLD
Alprazolam: NEGATIVE ng/mL (ref <0.50)
Aminoclonazepam: 0.6 ng/mL — ABNORMAL HIGH (ref <0.50)
Amobarbital: NEGATIVE ng/mL (ref <10)
Amphetamines: NEGATIVE ng/mL (ref <10)
Barbiturates: POSITIVE ng/mL — AB (ref <10)
Benzodiazepines: POSITIVE ng/mL — AB (ref <0.50)
Buprenorphine: NEGATIVE ng/mL (ref <0.10)
Butalbital: NEGATIVE ng/mL (ref <10)
Chlordiazepoxide: NEGATIVE ng/mL (ref <0.50)
Clonazepam: NEGATIVE ng/mL (ref <0.50)
Cocaine: NEGATIVE ng/mL (ref <5.0)
Codeine: NEGATIVE ng/mL (ref <2.5)
Diazepam: NEGATIVE ng/mL (ref <0.50)
Dihydrocodeine: 15.9 ng/mL — ABNORMAL HIGH (ref <2.5)
Fentanyl: NEGATIVE ng/mL (ref <0.10)
Flunitrazepam: NEGATIVE ng/mL (ref <0.50)
Flurazepam: NEGATIVE ng/mL (ref <0.50)
Heroin Metabolite: NEGATIVE ng/mL (ref <1.0)
Hydrocodone: 118.3 ng/mL — ABNORMAL HIGH (ref <2.5)
Hydromorphone: NEGATIVE ng/mL (ref <2.5)
Lorazepam: NEGATIVE ng/mL (ref <0.50)
MARIJUANA: NEGATIVE ng/mL (ref <2.5)
MDMA: NEGATIVE ng/mL (ref <10)
Meprobamate: NEGATIVE ng/mL (ref <2.5)
Methadone: NEGATIVE ng/mL (ref <5.0)
Midazolam: NEGATIVE ng/mL (ref <0.50)
Morphine: NEGATIVE ng/mL (ref <2.5)
Nicotine Metabolite: NEGATIVE ng/mL (ref <5.0)
Nordiazepam: NEGATIVE ng/mL (ref <0.50)
Norhydrocodone: 8.9 ng/mL — ABNORMAL HIGH (ref <2.5)
Noroxycodone: NEGATIVE ng/mL (ref <2.5)
Opiates: POSITIVE ng/mL — AB (ref <2.5)
Oxazepam: NEGATIVE ng/mL (ref <0.50)
Oxycodone: NEGATIVE ng/mL (ref <2.5)
Oxymorphone: NEGATIVE ng/mL (ref <2.5)
Pentobarbital: NEGATIVE ng/mL (ref <10)
Phencyclidine: NEGATIVE ng/mL (ref <10)
Phenobarbital: 136 ng/mL — ABNORMAL HIGH (ref <10)
Secobarbital: NEGATIVE ng/mL (ref <10)
Tapentadol: NEGATIVE ng/mL (ref <5.0)
Temazepam: NEGATIVE ng/mL (ref <0.50)
Tramadol: NEGATIVE ng/mL (ref <5.0)
Triazolam: NEGATIVE ng/mL (ref <0.50)
Zolpidem: NEGATIVE ng/mL (ref <5.0)

## 2023-07-24 LAB — DRUG TOX ALC METAB W/CON, ORAL FLD: Alcohol Metabolite: NEGATIVE ng/mL (ref <25)

## 2023-09-21 ENCOUNTER — Encounter: Attending: Physical Medicine & Rehabilitation | Admitting: Registered Nurse

## 2023-09-21 ENCOUNTER — Encounter: Payer: Self-pay | Admitting: Registered Nurse

## 2023-09-21 VITALS — BP 120/80 | HR 95 | Ht 60.0 in | Wt 189.8 lb

## 2023-09-21 DIAGNOSIS — M7061 Trochanteric bursitis, right hip: Secondary | ICD-10-CM | POA: Diagnosis present

## 2023-09-21 DIAGNOSIS — M7062 Trochanteric bursitis, left hip: Secondary | ICD-10-CM | POA: Diagnosis present

## 2023-09-21 DIAGNOSIS — G894 Chronic pain syndrome: Secondary | ICD-10-CM | POA: Diagnosis present

## 2023-09-21 DIAGNOSIS — Z79891 Long term (current) use of opiate analgesic: Secondary | ICD-10-CM | POA: Insufficient documentation

## 2023-09-21 DIAGNOSIS — M542 Cervicalgia: Secondary | ICD-10-CM | POA: Insufficient documentation

## 2023-09-21 DIAGNOSIS — Z5181 Encounter for therapeutic drug level monitoring: Secondary | ICD-10-CM | POA: Insufficient documentation

## 2023-09-21 DIAGNOSIS — M797 Fibromyalgia: Secondary | ICD-10-CM | POA: Insufficient documentation

## 2023-09-21 DIAGNOSIS — M47816 Spondylosis without myelopathy or radiculopathy, lumbar region: Secondary | ICD-10-CM | POA: Diagnosis not present

## 2023-09-21 DIAGNOSIS — M25512 Pain in left shoulder: Secondary | ICD-10-CM | POA: Insufficient documentation

## 2023-09-21 DIAGNOSIS — G8929 Other chronic pain: Secondary | ICD-10-CM | POA: Insufficient documentation

## 2023-09-21 DIAGNOSIS — M546 Pain in thoracic spine: Secondary | ICD-10-CM | POA: Diagnosis present

## 2023-09-21 DIAGNOSIS — M25511 Pain in right shoulder: Secondary | ICD-10-CM | POA: Insufficient documentation

## 2023-09-21 MED ORDER — HYDROCODONE-ACETAMINOPHEN 10-325 MG PO TABS: 1.0000 | ORAL_TABLET | Freq: Four times a day (QID) | ORAL | 0 refills | Status: DC | PRN

## 2023-09-21 NOTE — Progress Notes (Signed)
 Subjective:    Patient ID: Leah Barton, female    DOB: 26-Jul-1950, 73 y.o.   MRN: [id number]  HPI: Leah Barton is a 73 y.o. female who returns for follow up appointment for chronic pain and medication refill. She states her pain is located in her neck, bilateral shoulders, mid- lower back, right hip and bilateral knee pain. She rates her pain 5. Her current exercise regime is walking and performing stretching exercises.  Ms. Symonds Morphine equivalent is 40.00 MME. She  is also prescribed Clonazepam by Dr. Toy .We have discussed the black box warning of using opioids and benzodiazepines. I highlighted the dangers of using these drugs together and discussed the adverse events including respiratory suppression,   Last Oral Swab was Performed on 07/19/2023, it was consistent.     Pain Inventory Average Pain 6 Pain Right Now 5 My pain is sharp, burning, dull, tingling, and aching  In the last 24 hours, has pain interfered with the following? General activity 7 Relation with others 7 Enjoyment of life 7 What TIME of day is your pain at its worst? varies Sleep (in general) Fair  Pain is worse with: unsure Pain improves with: rest, pacing activities, and medication Relief from Meds: 4  No family history on file. Social History   Socioeconomic History   Marital status: Married    Spouse name: Not on file   Number of children: Not on file   Years of education: Not on file   Highest education level: Not on file  Occupational History   Not on file  Tobacco Use   Smoking status: Never   Smokeless tobacco: Never  Vaping Use   Vaping status: Never Used  Substance and Sexual Activity   Alcohol use: No   Drug use: No   Sexual activity: Not on file  Other Topics Concern   Not on file  Social History Narrative   Not on file   Social Drivers of Health   Financial Resource Strain: Not on file  Food Insecurity: Not on file  Transportation Needs: Not on file  Physical  Activity: Not on file  Stress: Not on file  Social Connections: Not on file   Past Surgical History:  Procedure Laterality Date   ANKLE SURGERY     ASPIRATION OF ABSCESS     CHOLECYSTECTOMY     DE QUERVAIN'S RELEASE     grastricbypas     HERNIA REPAIR     NASAL SEPTUM SURGERY     TOTAL VAGINAL HYSTERECTOMY     TUBAL LIGATION     Past Surgical History:  Procedure Laterality Date   ANKLE SURGERY     ASPIRATION OF ABSCESS     CHOLECYSTECTOMY     DE QUERVAIN'S RELEASE     grastricbypas     HERNIA REPAIR     NASAL SEPTUM SURGERY     TOTAL VAGINAL HYSTERECTOMY     TUBAL LIGATION     No past medical history on file. BP 120/80   Pulse 95   Ht 5' (1.524 m)   Wt 189 lb 12.8 oz (86.1 kg)   SpO2 92%   BMI 37.07 kg/m   Opioid Risk Score:   Fall Risk Score:  `1  Depression screen PHQ 2/9     09/21/2023   12:55 PM 05/19/2023    1:33 PM 03/24/2023   12:54 PM 01/26/2023   12:51 PM 11/25/2022   12:56 PM 09/22/2022    1:24 PM 07/29/2022  1:14 PM  Depression screen PHQ 2/9  Decreased Interest 1 1 0 0 1 3 1   Down, Depressed, Hopeless 1 1 0 0 1 2 1   PHQ - 2 Score 2 2 0 0 2 5 2   Altered sleeping      3   Tired, decreased energy      3   Change in appetite      3   Feeling bad or failure about yourself       2   Trouble concentrating      2   Moving slowly or fidgety/restless      0   Suicidal thoughts      0   PHQ-9 Score      18      Review of Systems  Musculoskeletal:  Positive for back pain, gait problem and myalgias.       Knee pain       Objective:   Physical Exam Vitals and nursing note reviewed.  Constitutional:      Appearance: Normal appearance. She is obese.  Neck:     Comments: Cervical Paraspinal Tenderness: C-5-C-6 Cardiovascular:     Rate and Rhythm: Normal rate and regular rhythm.     Pulses: Normal pulses.     Heart sounds: Normal heart sounds.  Pulmonary:     Effort: Pulmonary effort is normal.     Breath sounds: Normal breath sounds.   Musculoskeletal:     Comments: Normal Muscle Bulk and Muscle Testing Reveals:  Upper Extremities: Full ROM and Muscle Strength 5/5 Bilateral AC Joint Tenderness Thoracic and Lumbar Hypersensitivity Bilateral Greater Trochanter Tenderness: R>L Lower Extremities : Full ROM and Muscle Strength 5/5 Arises from Table slowly  Antalgic Gait     Skin:    General: Skin is warm and dry.  Neurological:     Mental Status: She is alert and oriented to person, place, and time.  Psychiatric:        Mood and Affect: Mood normal.        Behavior: Behavior normal.          Assessment & Plan:  1. Chronic low back pain with substantial spondylosis and scoliosis by MRI per Dr. Babs note. 09/21/2023. Refilled: Hydrocodone  10/325 mg one tablet every 6 hours as needed #120. Second script sent for the following month. We will continue the opioid monitoring program, this consists of regular clinic visits, examinations, urine drug screen, pill counts as well as use of [place]  Controlled Substance Reporting system. A 12 month History has been reviewed on the [place]  Controlled Substance Reporting System on 09/21/2023. 2. Lumbar Facet Arthropathy/ SI Joint Dysfunction: Continue with Facet Stretches: S/P  MBB on 07/12/2017. 09/21/2023. 3. Lumbar Radiculitis: Continue Gabapentin : Continue HEP as Tolerated. Continue to Monitor. 07/09//2025 4. Fibromyalgia: Continue Gabapentin  and HEP as Tolerated. 07/092025. 5. Bilateral Greater Trochanter Bursitis R>L: Ortho Following. Continue to Alternate with Ice and Heat Therapy. Continue to monitor. 09/21/2023 6. Right Hip Pain: Ortho Following.. Continue HEP as tolerated.  Continue to Monitor.09/21/2023   F/U in 2 months

## 2023-11-22 ENCOUNTER — Encounter: Attending: Physical Medicine & Rehabilitation | Admitting: Registered Nurse

## 2023-11-22 ENCOUNTER — Encounter: Payer: Self-pay | Admitting: Registered Nurse

## 2023-11-22 VITALS — BP 120/80 | HR 80 | Ht 60.0 in

## 2023-11-22 DIAGNOSIS — M7062 Trochanteric bursitis, left hip: Secondary | ICD-10-CM | POA: Diagnosis present

## 2023-11-22 DIAGNOSIS — Z5181 Encounter for therapeutic drug level monitoring: Secondary | ICD-10-CM | POA: Insufficient documentation

## 2023-11-22 DIAGNOSIS — Z79891 Long term (current) use of opiate analgesic: Secondary | ICD-10-CM | POA: Insufficient documentation

## 2023-11-22 DIAGNOSIS — M545 Low back pain, unspecified: Secondary | ICD-10-CM | POA: Diagnosis present

## 2023-11-22 DIAGNOSIS — M546 Pain in thoracic spine: Secondary | ICD-10-CM | POA: Insufficient documentation

## 2023-11-22 DIAGNOSIS — M25511 Pain in right shoulder: Secondary | ICD-10-CM | POA: Diagnosis present

## 2023-11-22 DIAGNOSIS — M7061 Trochanteric bursitis, right hip: Secondary | ICD-10-CM | POA: Diagnosis present

## 2023-11-22 DIAGNOSIS — M47816 Spondylosis without myelopathy or radiculopathy, lumbar region: Secondary | ICD-10-CM | POA: Diagnosis present

## 2023-11-22 DIAGNOSIS — M797 Fibromyalgia: Secondary | ICD-10-CM | POA: Insufficient documentation

## 2023-11-22 DIAGNOSIS — G8929 Other chronic pain: Secondary | ICD-10-CM | POA: Insufficient documentation

## 2023-11-22 DIAGNOSIS — M25512 Pain in left shoulder: Secondary | ICD-10-CM | POA: Insufficient documentation

## 2023-11-22 DIAGNOSIS — G894 Chronic pain syndrome: Secondary | ICD-10-CM | POA: Insufficient documentation

## 2023-11-22 MED ORDER — HYDROCODONE-ACETAMINOPHEN 10-325 MG PO TABS: 1.0000 | ORAL_TABLET | Freq: Four times a day (QID) | ORAL | 0 refills | Status: DC | PRN

## 2023-11-22 MED ORDER — GABAPENTIN 300 MG PO CAPS: 300.0000 mg | ORAL_CAPSULE | Freq: Two times a day (BID) | ORAL | 2 refills | Status: AC

## 2023-11-22 NOTE — Progress Notes (Signed)
 Subjective:    Patient ID: Leah Barton, female    DOB: Jun 24, 1950, 73 y.o.   MRN: [id number]  HPI: Leah Barton is a 73 y.o. female who returns for follow up appointment for chronic pain and medication refill. She states her pain is located in her mid- lower back and bilateral hips. Also reports increase pain in her mid- lower back, X-rays ordered. She rates her pain 6. Her current exercise regime is walking and performing stretching exercises.   Ms. Knebel Morphine equivalent is 40.00 MME.   Oral Swab was Performed today.   Pain Inventory Average Pain 6 Pain Right Now 6 My pain is constant, sharp, burning, dull, stabbing, tingling, and aching  In the last 24 hours, has pain interfered with the following? General activity 10 Relation with others 10 Enjoyment of life 10 What TIME of day is your pain at its worst? morning , daytime, evening, and night Sleep (in general) NA  Pain is worse with: some activites Pain improves with: rest, heat/ice, pacing activities, medication, and injections Relief from Meds: 8  No family history on file. Social History   Socioeconomic History   Marital status: Married    Spouse name: Not on file   Number of children: Not on file   Years of education: Not on file   Highest education level: Not on file  Occupational History   Not on file  Tobacco Use   Smoking status: Never   Smokeless tobacco: Never  Vaping Use   Vaping status: Never Used  Substance and Sexual Activity   Alcohol use: No   Drug use: No   Sexual activity: Not on file  Other Topics Concern   Not on file  Social History Narrative   Not on file   Social Drivers of Health   Financial Resource Strain: Not on file  Food Insecurity: Not on file  Transportation Needs: Not on file  Physical Activity: Not on file  Stress: Not on file  Social Connections: Not on file   Past Surgical History:  Procedure Laterality Date   ANKLE SURGERY     ASPIRATION OF ABSCESS      CHOLECYSTECTOMY     DE QUERVAIN'S RELEASE     grastricbypas     HERNIA REPAIR     NASAL SEPTUM SURGERY     TOTAL VAGINAL HYSTERECTOMY     TUBAL LIGATION     Past Surgical History:  Procedure Laterality Date   ANKLE SURGERY     ASPIRATION OF ABSCESS     CHOLECYSTECTOMY     DE QUERVAIN'S RELEASE     grastricbypas     HERNIA REPAIR     NASAL SEPTUM SURGERY     TOTAL VAGINAL HYSTERECTOMY     TUBAL LIGATION     No past medical history on file. BP 120/80   Pulse 80   Ht 5' (1.524 m)   SpO2 90%   BMI 37.07 kg/m   Opioid Risk Score:   Fall Risk Score:  `1  Depression screen PHQ 2/9     11/22/2023    1:10 PM 09/21/2023   12:55 PM 05/19/2023    1:33 PM 03/24/2023   12:54 PM 01/26/2023   12:51 PM 11/25/2022   12:56 PM 09/22/2022    1:24 PM  Depression screen PHQ 2/9  Decreased Interest 1 1 1  0 0 1 3  Down, Depressed, Hopeless 1 1 1  0 0 1 2  PHQ - 2 Score 2 2 2  0  0 2 5  Altered sleeping       3  Tired, decreased energy       3  Change in appetite       3  Feeling bad or failure about yourself        2  Trouble concentrating       2  Moving slowly or fidgety/restless       0  Suicidal thoughts       0  PHQ-9 Score       18    Review of Systems  Musculoskeletal:  Positive for back pain.  All other systems reviewed and are negative.      Objective:   Physical Exam Vitals and nursing note reviewed.  Constitutional:      Appearance: Normal appearance.  Cardiovascular:     Rate and Rhythm: Normal rate and regular rhythm.     Pulses: Normal pulses.     Heart sounds: Normal heart sounds.  Pulmonary:     Effort: Pulmonary effort is normal.     Breath sounds: Normal breath sounds.  Musculoskeletal:     Comments: Normal Muscle Bulk and Muscle Testing Reveals:  Upper Extremities: Full ROM and Muscle Strength 5/5  Thoracic Paraspinal Tenderness: T-7-T-10 Lumbar Hypersensitivity Bilateral Greater Trochanter Tenderness Lower Extremities: Full ROM and Muscle Strength  5/5 Arises from Chair slowly using cane for support Antalgic  Gait     Skin:    General: Skin is warm and dry.  Neurological:     Mental Status: She is alert and oriented to person, place, and time.  Psychiatric:        Mood and Affect: Mood normal.        Behavior: Behavior normal.          Assessment & Plan:  1. Chronic low back pain with substantial spondylosis and scoliosis by MRI per Dr. Babs note. 11/22/2023. Refilled: Hydrocodone  10/325 mg one tablet every 6 hours as needed #120. Second script sent for the following month. We will continue the opioid monitoring program, this consists of regular clinic visits, examinations, urine drug screen, pill counts as well as use of [place]  Controlled Substance Reporting system. A 12 month History has been reviewed on the [place]  Controlled Substance Reporting System on 11/22/2023. 2. Lumbar Facet Arthropathy/ SI Joint Dysfunction: Continue with Facet Stretches: S/P  MBB on 07/12/2017. 11/22/2023. 3. Lumbar Radiculitis: Continue Gabapentin : Continue HEP as Tolerated. Continue to Monitor. 09/09//2025 4. Fibromyalgia: Continue Gabapentin  and HEP as Tolerated. 09/092025. 5. Bilateral Greater Trochanter Bursitis R>L: Ortho Following. Continue to Alternate with Ice and Heat Therapy. Continue to monitor. 11/22/2023 6. Right Hip Pain: Ortho Following.. Continue HEP as tolerated.  Continue to Monitor.11/22/2023   F/U in 2 months

## 2023-11-25 LAB — DRUG TOX MONITOR 1 W/CONF, ORAL FLD
Alprazolam: NEGATIVE ng/mL (ref <0.50)
Aminoclonazepam: 1.58 ng/mL — ABNORMAL HIGH (ref <0.50)
Amobarbital: NEGATIVE ng/mL (ref <10)
Amphetamines: NEGATIVE ng/mL (ref <10)
Barbiturates: POSITIVE ng/mL — AB (ref <10)
Benzodiazepines: POSITIVE ng/mL — AB (ref <0.50)
Buprenorphine: NEGATIVE ng/mL (ref <0.10)
Butalbital: NEGATIVE ng/mL (ref <10)
Chlordiazepoxide: NEGATIVE ng/mL (ref <0.50)
Clonazepam: NEGATIVE ng/mL (ref <0.50)
Cocaine: NEGATIVE ng/mL (ref <5.0)
Codeine: NEGATIVE ng/mL (ref <2.5)
Diazepam: NEGATIVE ng/mL (ref <0.50)
Dihydrocodeine: 21 ng/mL — ABNORMAL HIGH (ref <2.5)
Fentanyl: NEGATIVE ng/mL (ref <0.10)
Flunitrazepam: NEGATIVE ng/mL (ref <0.50)
Flurazepam: NEGATIVE ng/mL (ref <0.50)
Heroin Metabolite: NEGATIVE ng/mL (ref <1.0)
Hydrocodone: 125.9 ng/mL — ABNORMAL HIGH (ref <2.5)
Hydromorphone: NEGATIVE ng/mL (ref <2.5)
Lorazepam: NEGATIVE ng/mL (ref <0.50)
MARIJUANA: NEGATIVE ng/mL (ref <2.5)
MDMA: NEGATIVE ng/mL (ref <10)
Meprobamate: NEGATIVE ng/mL (ref <2.5)
Methadone: NEGATIVE ng/mL (ref <5.0)
Midazolam: NEGATIVE ng/mL (ref <0.50)
Morphine: NEGATIVE ng/mL (ref <2.5)
Nicotine Metabolite: NEGATIVE ng/mL (ref <5.0)
Nordiazepam: NEGATIVE ng/mL (ref <0.50)
Norhydrocodone: 18.7 ng/mL — ABNORMAL HIGH (ref <2.5)
Noroxycodone: NEGATIVE ng/mL (ref <2.5)
Opiates: POSITIVE ng/mL — AB (ref <2.5)
Oxazepam: NEGATIVE ng/mL (ref <0.50)
Oxycodone: NEGATIVE ng/mL (ref <2.5)
Oxymorphone: NEGATIVE ng/mL (ref <2.5)
Pentobarbital: NEGATIVE ng/mL (ref <10)
Phencyclidine: NEGATIVE ng/mL (ref <10)
Phenobarbital: >500 ng/mL — ABNORMAL HIGH (ref <10)
Secobarbital: NEGATIVE ng/mL (ref <10)
Tapentadol: NEGATIVE ng/mL (ref <5.0)
Temazepam: NEGATIVE ng/mL (ref <0.50)
Tramadol: NEGATIVE ng/mL (ref <5.0)
Triazolam: NEGATIVE ng/mL (ref <0.50)
Zolpidem: NEGATIVE ng/mL (ref <5.0)

## 2023-11-25 LAB — DRUG TOX ALC METAB W/CON, ORAL FLD: Alcohol Metabolite: NEGATIVE ng/mL (ref <25)

## 2024-01-18 ENCOUNTER — Ambulatory Visit: Admitting: Registered Nurse

## 2024-01-18 ENCOUNTER — Encounter: Payer: Self-pay | Admitting: Physical Medicine & Rehabilitation

## 2024-01-18 ENCOUNTER — Encounter: Attending: Physical Medicine & Rehabilitation | Admitting: Physical Medicine & Rehabilitation

## 2024-01-18 VITALS — BP 113/76 | HR 63 | Ht 60.0 in | Wt 190.0 lb

## 2024-01-18 DIAGNOSIS — M4126 Other idiopathic scoliosis, lumbar region: Secondary | ICD-10-CM | POA: Diagnosis present

## 2024-01-18 DIAGNOSIS — M25511 Pain in right shoulder: Secondary | ICD-10-CM | POA: Insufficient documentation

## 2024-01-18 DIAGNOSIS — G8929 Other chronic pain: Secondary | ICD-10-CM | POA: Insufficient documentation

## 2024-01-18 DIAGNOSIS — M4180 Other forms of scoliosis, site unspecified: Secondary | ICD-10-CM | POA: Diagnosis present

## 2024-01-18 DIAGNOSIS — M47816 Spondylosis without myelopathy or radiculopathy, lumbar region: Secondary | ICD-10-CM | POA: Insufficient documentation

## 2024-01-18 DIAGNOSIS — M546 Pain in thoracic spine: Secondary | ICD-10-CM | POA: Diagnosis not present

## 2024-01-18 DIAGNOSIS — M25512 Pain in left shoulder: Secondary | ICD-10-CM | POA: Diagnosis present

## 2024-01-18 MED ORDER — HYDROCODONE-ACETAMINOPHEN 10-325 MG PO TABS: 1.0000 | ORAL_TABLET | Freq: Four times a day (QID) | ORAL | 0 refills | Status: DC | PRN

## 2024-01-18 MED ORDER — BUPRENORPHINE 5 MCG/HR TD PTWK: 1.0000 | MEDICATED_PATCH | TRANSDERMAL | 1 refills | Status: DC

## 2024-01-18 NOTE — Progress Notes (Signed)
 Subjective:    Patient ID: Leah Barton, female    DOB: [birth year], 73 y.o.   MRN: [id number]  HPI  Leah Barton is here in follow up of her chronic pain. She has had worsening back pain over the last few months in particular. She hasn't been doing anything different from activity standpoint although her husband still needs a lot of help. She has more pain in the throacic area, centrally. She describes the pain in her back  as burning. She reports numbness in her toes which comes and goes.  She remains on hydrocdone 10mg  4x daily. Additionally she takes gabapentin , flexeril.   She uses a cane for balance. An xray of her back was ordered  but not avaialbe for me to review.     Pain Inventory Average Pain 6 Pain Right Now 4 My pain is constant, sharp, burning, dull, stabbing, tingling, and aching  In the last 24 hours, has pain interfered with the following? General activity 6 Relation with others 6 Enjoyment of life 6 What TIME of day is your pain at its worst? varies Sleep (in general) Fair  Pain is worse with: standing and unsure Pain improves with: medication Relief from Meds: little  No family history on file. Social History   Socioeconomic History   Marital status: Married    Spouse name: Not on file   Number of children: Not on file   Years of education: Not on file   Highest education level: Not on file  Occupational History   Not on file  Tobacco Use   Smoking status: Never   Smokeless tobacco: Never  Vaping Use   Vaping status: Never Used  Substance and Sexual Activity   Alcohol use: No   Drug use: No   Sexual activity: Not on file  Other Topics Concern   Not on file  Social History Narrative   Not on file   Social Drivers of Health   Financial Resource Strain: Not on file  Food Insecurity: Not on file  Transportation Needs: Not on file  Physical Activity: Not on file  Stress: Not on file  Social Connections: Not on file   Past Surgical History:   Procedure Laterality Date   ANKLE SURGERY     ASPIRATION OF ABSCESS     CHOLECYSTECTOMY     DE QUERVAIN'S RELEASE     grastricbypas     HERNIA REPAIR     NASAL SEPTUM SURGERY     TOTAL VAGINAL HYSTERECTOMY     TUBAL LIGATION     Past Surgical History:  Procedure Laterality Date   ANKLE SURGERY     ASPIRATION OF ABSCESS     CHOLECYSTECTOMY     DE QUERVAIN'S RELEASE     grastricbypas     HERNIA REPAIR     NASAL SEPTUM SURGERY     TOTAL VAGINAL HYSTERECTOMY     TUBAL LIGATION     History reviewed. No pertinent past medical history. Ht 5' (1.524 m)   Wt 190 lb (86.2 kg)   BMI 37.11 kg/m   Opioid Risk Score:   Fall Risk Score:  `1  Depression screen [facility] 2/9     01/18/2024   12:53 PM 11/22/2023    1:10 PM 09/21/2023   12:55 PM 05/19/2023    1:33 PM 03/24/2023   12:54 PM 01/26/2023   12:51 PM 11/25/2022   12:56 PM  Depression screen PHQ 2/9  Decreased Interest 1 1 1 1  0 0 1  Down, Depressed, Hopeless  1 1 1  0 0 1  PHQ - 2 Score 1 2 2 2  0 0 2    Review of Systems  Musculoskeletal:  Positive for back pain and gait problem.       Pain in both hips, both feet, right hand pain  All other systems reviewed and are negative.      Objective:   Physical Exam General: No acute distress HEENT: NCAT, EOMI, oral membranes moist Cards: reg rate  Chest: normal effort Abdomen: Soft, NT, ND Skin: dry, intact Extremities: no edema Psych: pleasant and appropriate  Skin: Clean and intact without signs of breakdown Neuro:  Alert and oriented x 3. Normal insight and awareness. Intact Memory. Normal language and speech. Cranial nerve exam unremarkable  Musculoskeletal: . Leans to left,still with significant levoscoliosis evident.  Has pain with palpation over the lower lumbar spine and along the belt line and into the mid          Assessment & Plan:  1. Chronic low back pain with substantial spondylosis and scoliosis by MRI. Symptoms on examination today are most c/w facet  arthropathy and SI joint dysfunction. Symptoms are most severe in the right low back and pelvis.  2. Fibromyalgia 3. Chronic cervicalgia 4. Right shoulder pain 5. Dry eye syndrome 6. Thoracic spine pain after fall, hx of osteoporosis.  7. Right hip trochanteric bursitis     Plan:  1. maintain reasonable activity as possible.  2.  Again, we discussed her MRI results. She has significant levoscoliosis with apex in upper lumbar spine/low thoracic. We can consider another MRI to help determine further direction. Injections vs surgical consult? 3.  Begin trial of butrans patch 88mcg/week to help reduce pain levels more consistently      4. Refilled norco 10/ #120.  -We will continue the controlled substance monitoring program, this consists of regular clinic visits, examinations, routine drug screening, pill counts as well as use of [place]  Controlled Substance Reporting System. [place].      15 minutes of face to face patient care time were spent during this visit. All questions were encouraged and answered.  Follow up with me in about 2 months.

## 2024-01-18 NOTE — Patient Instructions (Signed)
 ALWAYS FEEL FREE TO CALL OUR OFFICE WITH ANY PROBLEMS OR QUESTIONS [phone]  **PLEASE NOTE** ALL MEDICATION REFILL REQUESTS (INCLUDING CONTROLLED SUBSTANCES) NEED TO BE MADE AT LEAST 7 DAYS PRIOR TO REFILL BEING DUE. ANY REFILL REQUESTS INSIDE THAT TIME FRAME MAY RESULT IN DELAYS IN RECEIVING YOUR PRESCRIPTION.    Let me know in about a month how the butrans patch is doing. We can increase the dose without you coming into the office.

## 2024-01-20 ENCOUNTER — Telehealth: Payer: Self-pay | Admitting: Physical Medicine & Rehabilitation

## 2024-01-20 NOTE — Telephone Encounter (Signed)
 Patient went to pharmacy to get Butran patches and they told her that our office needed to call them to let them know why she needed patches.  The pharmacy also said they would send something to us .

## 2024-01-24 NOTE — Telephone Encounter (Signed)
 Patient called stating she needs a PA for Butrans patches

## 2024-01-30 ENCOUNTER — Telehealth: Payer: Self-pay

## 2024-01-31 ENCOUNTER — Telehealth: Payer: Self-pay

## 2024-01-31 NOTE — Telephone Encounter (Signed)
 Patient called stating that the Butrans patch is $81 and she can't afford it.

## 2024-01-31 NOTE — Telephone Encounter (Signed)
 Done

## 2024-02-08 NOTE — Telephone Encounter (Signed)
 Duplicate encounter opened  erroneous encounter

## 2024-03-02 ENCOUNTER — Other Ambulatory Visit: Payer: Self-pay | Admitting: Physical Medicine & Rehabilitation

## 2024-03-02 DIAGNOSIS — G8929 Other chronic pain: Secondary | ICD-10-CM

## 2024-03-02 DIAGNOSIS — M47816 Spondylosis without myelopathy or radiculopathy, lumbar region: Secondary | ICD-10-CM

## 2024-03-05 ENCOUNTER — Telehealth: Payer: Self-pay

## 2024-03-05 NOTE — Telephone Encounter (Signed)
 PDMP was Reviewed.  Hydrocodone  e-scribed, Will speak with clinical staff regarding Butrans  Patches. Mr. deara bober understanding.

## 2024-03-05 NOTE — Telephone Encounter (Signed)
 Called [patients spouse to make aware that the Butrans  patch was approved from insurance in November and is valid until 2026.  Spouse states that the pain pills need approval then.  I will call pharmacy to check status and to see why they have not filled the rx for Butrans .

## 2024-03-05 NOTE — Telephone Encounter (Signed)
 [facility] to check status of rx for Butrans  and Hydrocodone .  Per Pharmacist the Butrans  was ordered and will be delivered along with the Hydrocodone  tomorrow.  No PA is required for the medication and the co-pay is 4.00 per pharmacy.

## 2024-03-20 ENCOUNTER — Encounter: Admitting: Registered Nurse

## 2024-03-27 ENCOUNTER — Other Ambulatory Visit: Payer: Self-pay | Admitting: Physical Medicine & Rehabilitation

## 2024-03-27 DIAGNOSIS — M47816 Spondylosis without myelopathy or radiculopathy, lumbar region: Secondary | ICD-10-CM

## 2024-03-27 DIAGNOSIS — M4126 Other idiopathic scoliosis, lumbar region: Secondary | ICD-10-CM

## 2024-04-04 ENCOUNTER — Encounter: Payer: Self-pay | Admitting: Physical Medicine & Rehabilitation

## 2024-04-04 ENCOUNTER — Encounter: Attending: Physical Medicine & Rehabilitation | Admitting: Physical Medicine & Rehabilitation

## 2024-04-04 VITALS — BP 109/67 | HR 58 | Ht 60.0 in | Wt 190.4 lb

## 2024-04-04 DIAGNOSIS — M4126 Other idiopathic scoliosis, lumbar region: Secondary | ICD-10-CM | POA: Diagnosis not present

## 2024-04-04 DIAGNOSIS — M25511 Pain in right shoulder: Secondary | ICD-10-CM | POA: Diagnosis not present

## 2024-04-04 DIAGNOSIS — M546 Pain in thoracic spine: Secondary | ICD-10-CM | POA: Insufficient documentation

## 2024-04-04 DIAGNOSIS — M25512 Pain in left shoulder: Secondary | ICD-10-CM | POA: Insufficient documentation

## 2024-04-04 DIAGNOSIS — M47816 Spondylosis without myelopathy or radiculopathy, lumbar region: Secondary | ICD-10-CM | POA: Diagnosis not present

## 2024-04-04 DIAGNOSIS — G8929 Other chronic pain: Secondary | ICD-10-CM | POA: Diagnosis not present

## 2024-04-04 DIAGNOSIS — M797 Fibromyalgia: Secondary | ICD-10-CM | POA: Insufficient documentation

## 2024-04-04 DIAGNOSIS — M4180 Other forms of scoliosis, site unspecified: Secondary | ICD-10-CM | POA: Diagnosis not present

## 2024-04-04 MED ORDER — BUPRENORPHINE 10 MCG/HR TD PTWK: 1.0000 | MEDICATED_PATCH | TRANSDERMAL | 4 refills | Status: AC

## 2024-04-04 MED ORDER — HYDROCODONE-ACETAMINOPHEN 10-325 MG PO TABS: 1.0000 | ORAL_TABLET | Freq: Four times a day (QID) | ORAL | 0 refills | Status: AC | PRN

## 2024-04-04 NOTE — Patient Instructions (Signed)
" °  VISIT SUMMARY: During your visit, we discussed your chronic low back pain related to lumbar spondylosis and scoliosis. We reviewed your current treatment and made adjustments to help manage your symptoms better.  YOUR PLAN: -CHRONIC LOW BACK PAIN: Chronic low back pain is a long-term pain in the lower back, often due to conditions like lumbar spondylosis and scoliosis. We have increased your Butrans  patch to 10 mcg/hr to help provide better pain relief. Additionally, we discussed dietary changes to reduce inflammation and pain, such as minimizing refined sugar and alcohol intake. Staying hydrated and moderating caffeine and artificial sweeteners are also important.  INSTRUCTIONS: Please follow up with us  if you experience any issues with the new Butrans  patch dosage or if your symptoms do not improve. Continue to monitor your diet and hydration, and let us  know if you have any questions or concerns.    Contains text generated by [employer].   "

## 2024-04-04 NOTE — Progress Notes (Signed)
 "  Subjective:    Patient ID: Leah Barton, female    DOB: 1950/03/22, 74 y.o.   MRN: [id number]  HPI  Discussed the use of AI scribe software for clinical note transcription with the patient, who gave verbal consent to proceed.  History of Present Illness Leah Barton is a 74 year old female with lumbar spondylosis and scoliosis who presents for follow-up of chronic low back pain.  Chronic Low Back Pain: - Persistent pain with fluctuating severity; some days are painless while other days are extremely severe - Pain intensity is influenced by weather changes, mood, sleep quality, and dietary habits - Increased pain during the recent holidays prevented participation in family gatherings  Analgesic Therapy and Tolerability: - Currently using Butrans  patch at the lowest dose - Patch provides partial relief of symptoms - Generally well tolerated; experienced a minor skin rash once after patch removal  Dietary and Lifestyle Factors: - Primarily drinks water, with occasional intake of Coke Zero and diet milk - No current alcohol consumption - Family history of alcoholism - Alcohol and refined sugar exacerbate joint pain and inflammation     Pain Inventory Average Pain 5-7 Pain Right Now 5 My pain is sharp, burning, dull, stabbing, tingling, and aching  In the last 24 hours, has pain interfered with the following? General activity 7 Relation with others 7 Enjoyment of life 7 What TIME of day is your pain at its worst? morning , daytime, evening, and night Sleep (in general) Fair  Pain is worse with: walking, bending, sitting, inactivity, unsure, and some activites Pain improves with: rest, heat/ice, pacing activities, and medication Relief from Meds: Fair  No family history on file. Social History   Socioeconomic History   Marital status: Married    Spouse name: Not on file   Number of children: Not on file   Years of education: Not on file   Highest education level:  Not on file  Occupational History   Not on file  Tobacco Use   Smoking status: Never   Smokeless tobacco: Never  Vaping Use   Vaping status: Never Used  Substance and Sexual Activity   Alcohol use: No   Drug use: No   Sexual activity: Not on file  Other Topics Concern   Not on file  Social History Narrative   Not on file   Social Drivers of Health   Tobacco Use: Low Risk (11/22/2023)   Patient History    Smoking Tobacco Use: Never    Smokeless Tobacco Use: Never    Passive Exposure: Not on file  Financial Resource Strain: Not on file  Food Insecurity: Not on file  Transportation Needs: Not on file  Physical Activity: Not on file  Stress: Not on file  Social Connections: Not on file  Depression (PHQ2-9): Low Risk (01/18/2024)   Depression (PHQ2-9)    PHQ-2 Score: 1  Alcohol Screen: Not on file  Housing: Not on file  Utilities: Not on file  Health Literacy: Not on file   Past Surgical History:  Procedure Laterality Date   ANKLE SURGERY     ASPIRATION OF ABSCESS     CHOLECYSTECTOMY     DE QUERVAIN'S RELEASE     grastricbypas     HERNIA REPAIR     NASAL SEPTUM SURGERY     TOTAL VAGINAL HYSTERECTOMY     TUBAL LIGATION     Past Surgical History:  Procedure Laterality Date   ANKLE SURGERY     ASPIRATION OF  ABSCESS     CHOLECYSTECTOMY     DE QUERVAIN'S RELEASE     grastricbypas     HERNIA REPAIR     NASAL SEPTUM SURGERY     TOTAL VAGINAL HYSTERECTOMY     TUBAL LIGATION     No past medical history on file. BP 109/67 (Patient Position: Sitting, Cuff Size: Large)   Pulse (!) 58   Ht 5' (1.524 m)   Wt 190 lb 6.4 oz (86.4 kg)   SpO2 94%   BMI 37.18 kg/m   Opioid Risk Score:   Fall Risk Score:  `1  Depression screen [facility] 2/9     01/18/2024   12:53 PM 11/22/2023    1:10 PM 09/21/2023   12:55 PM 05/19/2023    1:33 PM 03/24/2023   12:54 PM 01/26/2023   12:51 PM 11/25/2022   12:56 PM  Depression screen PHQ 2/9  Decreased Interest 1 1 1 1  0 0 1  Down, Depressed,  Hopeless  1 1 1  0 0 1  PHQ - 2 Score 1 2 2 2  0 0 2       Review of Systems  Musculoskeletal:  Positive for back pain.       Low back pain, right knee pain, left ankle pain  All other systems reviewed and are negative.      Objective:   Physical Exam  General: No acute distress HEENT: NCAT, EOMI, oral membranes moist Cards: reg rate  Chest: normal effort Abdomen: Soft, NT, ND Skin: dry, intact Extremities: no edema Psych: pleasant and appropriate  Skin: Clean and intact without signs of breakdown Neuro:  Alert and oriented x 3. Normal insight and awareness. Intact Memory. Normal language and speech. Cranial nerve exam unremarkable. Uses straight cane for balance. Musculoskeletal: .   levoscoliosis thoraco-lumbar spine, leans to left while seated.            Assessment & Plan:  1. Chronic low back pain with substantial spondylosis and scoliosis by MRI. Symptoms on examination today are most c/w facet arthropathy and SI joint dysfunction. Symptoms are most severe in the right low back and pelvis.  2. Fibromyalgia 3. Chronic cervicalgia 4. Right shoulder pain 5. Dry eye syndrome 6. Thoracic spine pain after fall, hx of osteoporosis.  7. Right hip trochanteric bursitis     Plan:  1. maintain reasonable activity as possible.  2.  She hasn't gone for f/u MRI yet. Once she does we can use the results to help determine further direction. Injections vs surgical consult? 3. Increase butrans  10mg /week to see if we can further reduce pain control.      4. Refilled norco 10/ #120.  We will continue the controlled substance monitoring program, this consists of regular clinic visits, examinations, routine drug screening, pill counts as well as use of [place]  Controlled [place] Reporting System. [place].         15 minutes of face to face patient care time were spent during this visit. All questions were encouraged and answered.  Follow up with NP in about  2 months.         "

## 2024-04-18 ENCOUNTER — Telehealth: Payer: Self-pay | Admitting: *Deleted

## 2024-04-18 NOTE — Telephone Encounter (Signed)
 [name] called about his wife Leah Barton needing to go back to the old patch because the new patch copay is >$150 and they cannot afford that amount.

## 2024-04-19 ENCOUNTER — Encounter: Payer: Self-pay | Admitting: *Deleted

## 2024-06-06 ENCOUNTER — Encounter: Admitting: Physical Medicine & Rehabilitation
